# Patient Record
Sex: Male | Born: 1980 | Race: White | Hispanic: No | Marital: Married | Smoking: Never smoker
Health system: Southern US, Community
[De-identification: ages and names within clinical notes are randomized; demographics above are authoritative.]

## PROBLEM LIST (undated history)

## (undated) DIAGNOSIS — T7840XA Allergy, unspecified, initial encounter: Secondary | ICD-10-CM

## (undated) HISTORY — PX: EYE SURGERY: SHX253

## (undated) HISTORY — DX: Allergy, unspecified, initial encounter: T78.40XA

---

## 2020-08-04 ENCOUNTER — Encounter: Payer: Self-pay | Admitting: Internal Medicine

## 2020-08-04 ENCOUNTER — Other Ambulatory Visit: Payer: Self-pay

## 2020-08-04 ENCOUNTER — Ambulatory Visit (INDEPENDENT_AMBULATORY_CARE_PROVIDER_SITE_OTHER): Payer: BLUE CROSS/BLUE SHIELD | Admitting: Internal Medicine

## 2020-08-04 VITALS — BP 127/80 | HR 72 | Temp 97.8°F | Resp 18 | Ht 76.0 in | Wt 204.4 lb

## 2020-08-04 DIAGNOSIS — Z9189 Other specified personal risk factors, not elsewhere classified: Secondary | ICD-10-CM | POA: Insufficient documentation

## 2020-08-04 DIAGNOSIS — Z7689 Persons encountering health services in other specified circumstances: Secondary | ICD-10-CM | POA: Insufficient documentation

## 2020-08-04 DIAGNOSIS — Z8719 Personal history of other diseases of the digestive system: Secondary | ICD-10-CM

## 2020-08-04 DIAGNOSIS — Z889 Allergy status to unspecified drugs, medicaments and biological substances status: Secondary | ICD-10-CM | POA: Insufficient documentation

## 2020-08-04 DIAGNOSIS — Z23 Encounter for immunization: Secondary | ICD-10-CM | POA: Diagnosis not present

## 2020-08-04 NOTE — Assessment & Plan Note (Addendum)
Had colonoscopy about 2 years ago after episode of diverticulitis No active complaints currently

## 2020-08-04 NOTE — Patient Instructions (Signed)
Please get blood tests done today. We will discuss the blood test results in the next visit.  Please bring the wellness form in the next visit.  Please continue to follow healthy diet, consisting green vegetables and fruits. Please continue to perform moderate exercise as tolerated, at least 150 mins/week.

## 2020-08-04 NOTE — Assessment & Plan Note (Signed)
Allergy to pets (cats and dogs) according to the allergy reports Although patient has 2 cats, and does not have any severe reaction  Allergic to pollen, uses Flonase PRN

## 2020-08-04 NOTE — Progress Notes (Signed)
New Patient Office Visit  Subjective:  Patient ID: Kenneth Owens, male    DOB: Apr 02, 1981  Age: 39 y.o. MRN: [id number]  CC:  Chief Complaint  Patient presents with  . New Patient (Initial Visit)    new pt establishing care needs wellness for work he is fasting today for labs and will set up wellness appt this week     HPI Kenneth Owens is a 39 year old male with no significant past medical history presents for establishing care.  Patient moved from [place] recently and has not had any PCP for long time.  He states that he has been in good health overall and denies any active complaints currently.  Patient has had history of allergies in the past and has brought reports of allergy testing.  Although the allergy tests suggest tendency to cats, he states that he has 2 cats at home and has not had any severe reaction to that.  He reports a history of colonoscopy about 2 years ago after he had an episode of diverticulitis.  He denies active smoking.  He takes alcohol about twice in a week.  He denies any other substance use.  He is up-to-date with Covid vaccination.  He received flu vaccine in the office today.  Patient has brought wellness form for work, which requires lipid profile and fasting glucose levels.  Blood tests are ordered today.  Past Medical History:  Diagnosis Date  . Allergy    [facility] 08/01/2020    Past Surgical History:  Procedure Laterality Date  . EYE SURGERY N/A    [facility] 08/01/2020    History reviewed. No pertinent family history.  Social History   Socioeconomic History  . Marital status: Married    Spouse name: Not on file  . Number of children: Not on file  . Years of education: Not on file  . Highest education level: Not on file  Occupational History    Comment: [employer]  Tobacco Use  . Smoking status: Never Smoker  . Smokeless tobacco: Never Used  Substance and Sexual Activity  . Alcohol use: Yes    Comment: at least twice in a week  . Drug use:  Never  . Sexual activity: Never  Other Topics Concern  . Not on file  Social History Narrative  . Not on file   Social Determinants of Health   Financial Resource Strain:   . Difficulty of Paying Living Expenses: Not on file  Food Insecurity:   . Worried About Charity fundraiser in the Last Year: Not on file  . Ran Out of Food in the Last Year: Not on file  Transportation Needs:   . Lack of Transportation (Medical): Not on file  . Lack of Transportation (Non-Medical): Not on file  Physical Activity:   . Days of Exercise per Week: Not on file  . Minutes of Exercise per Session: Not on file  Stress:   . Feeling of Stress : Not on file  Social Connections:   . Frequency of Communication with Friends and Family: Not on file  . Frequency of Social Gatherings with Friends and Family: Not on file  . Attends Religious Services: Not on file  . Active Member of Clubs or Organizations: Not on file  . Attends Archivist Meetings: Not on file  . Marital Status: Not on file  Intimate Partner Violence:   . Fear of Current or Ex-Partner: Not on file  . Emotionally Abused: Not on file  . Physically  Abused: Not on file  . Sexually Abused: Not on file    ROS Review of Systems  Constitutional: Negative for chills and fever.  HENT: Negative for congestion, sinus pressure, sinus pain, sneezing and sore throat.   Eyes: Negative for pain and discharge.  Respiratory: Negative for cough and shortness of breath.   Cardiovascular: Negative for chest pain and palpitations.  Gastrointestinal: Negative for constipation, diarrhea, nausea and vomiting.  Endocrine: Negative for polydipsia and polyuria.  Genitourinary: Negative for dysuria and hematuria.  Musculoskeletal: Negative for neck pain and neck stiffness.  Skin: Negative for rash.  Neurological: Negative for dizziness, weakness, numbness and headaches.  Psychiatric/Behavioral: Negative for agitation and behavioral problems.     Objective:   Today's Vitals: BP 127/80 (BP Location: Right Arm, Patient Position: Sitting, Cuff Size: Normal)   Pulse 72   Temp 97.8 F (36.6 C) (Temporal)   Resp 18   Ht _0  (1.93 m)   Wt 204 lb 6.4 oz (92.7 kg)   SpO2 97%   BMI 24.88 kg/m   Physical Exam Vitals reviewed.  Constitutional:      General: He is not in acute distress.    Appearance: He is not diaphoretic.  HENT:     Head: Normocephalic and atraumatic.     Nose: Nose normal.     Mouth/Throat:     Mouth: Mucous membranes are moist.  Eyes:     General: No scleral icterus.    Extraocular Movements: Extraocular movements intact.     Pupils: Pupils are equal, round, and reactive to light.  Cardiovascular:     Rate and Rhythm: Normal rate and regular rhythm.     Pulses: Normal pulses.     Heart sounds: Normal heart sounds. No murmur heard.   Pulmonary:     Breath sounds: Normal breath sounds. No wheezing or rales.  Abdominal:     Palpations: Abdomen is soft.     Tenderness: There is no abdominal tenderness.  Musculoskeletal:     Cervical back: Neck supple. No tenderness.     Right lower leg: No edema.     Left lower leg: No edema.  Skin:    General: Skin is warm.     Findings: No rash.  Neurological:     General: No focal deficit present.     Mental Status: He is alert and oriented to person, place, and time.     Sensory: No sensory deficit.     Motor: No weakness.  Psychiatric:        Mood and Affect: Mood normal.        Behavior: Behavior normal.     Assessment & Plan:   Problem List Items Addressed This Visit      Other   Encounter to establish care    Care established Previous chart reviewed History and medications reviewed with the patient      Relevant Orders   CBC with Differential   CMP14+EGFR   Lipid Profile   Hepatitis C Antibody   T4 AND TSH   H/O diverticulitis of colon    Had colonoscopy about 2 years ago after episode of diverticulitis No active complaints  currently      H/O multiple allergies    Allergy to pets (cats and dogs) according to the allergy reports Although patient has 2 cats, and does not have any severe reaction  Allergic to pollen, uses Flonase PRN       Other Visit Diagnoses    Need  for immunization against influenza    -  Primary   Relevant Orders   Flu Vaccine QUAD 36+ mos IM (Completed)      Outpatient Encounter Medications as of 08/04/2020  Medication Sig  . fluticasone (FLONASE) 50 MCG/ACT nasal spray Place 1 spray into both nostrils as needed.   No facility-administered encounter medications on file as of 08/04/2020.    Follow-up: Return in about 1 week (around 08/11/2020) for Wellness form for work.   Lindell Spar, MD

## 2020-08-04 NOTE — Assessment & Plan Note (Signed)
Care established Previous chart reviewed History and medications reviewed with the patient 

## 2020-08-05 LAB — LIPID PANEL
Chol/HDL Ratio: 4.1 ratio (ref 0.0–5.0)
Cholesterol, Total: 236 mg/dL — ABNORMAL HIGH (ref 100–199)
HDL: 58 mg/dL (ref >39)
LDL Chol Calc (NIH): 156 mg/dL — ABNORMAL HIGH (ref 0–99)
Triglycerides: 124 mg/dL (ref 0–149)
VLDL Cholesterol Cal: 22 mg/dL (ref 5–40)

## 2020-08-05 LAB — CBC WITH DIFFERENTIAL/PLATELET
Basophils Absolute: 0.1 10*3/uL (ref 0.0–0.2)
Basos: 2 %
EOS (ABSOLUTE): 0.1 10*3/uL (ref 0.0–0.4)
Eos: 2 %
Hematocrit: 43.7 % (ref 37.5–51.0)
Hemoglobin: 14.7 g/dL (ref 13.0–17.7)
Immature Grans (Abs): 0 10*3/uL (ref 0.0–0.1)
Immature Granulocytes: 0 %
Lymphocytes Absolute: 1.5 10*3/uL (ref 0.7–3.1)
Lymphs: 30 %
MCH: 30.6 pg (ref 26.6–33.0)
MCHC: 33.6 g/dL (ref 31.5–35.7)
MCV: 91 fL (ref 79–97)
Monocytes Absolute: 0.5 10*3/uL (ref 0.1–0.9)
Monocytes: 9 %
Neutrophils Absolute: 3 10*3/uL (ref 1.4–7.0)
Neutrophils: 57 %
Platelets: 238 10*3/uL (ref 150–450)
RBC: 4.8 x10E6/uL (ref 4.14–5.80)
RDW: 11.9 % (ref 11.6–15.4)
WBC: 5.2 10*3/uL (ref 3.4–10.8)

## 2020-08-05 LAB — CMP14+EGFR
ALT: 35 IU/L (ref 0–44)
AST: 23 IU/L (ref 0–40)
Albumin/Globulin Ratio: 2.1 (ref 1.2–2.2)
Albumin: 4.7 g/dL (ref 4.0–5.0)
Alkaline Phosphatase: 94 IU/L (ref 44–121)
BUN/Creatinine Ratio: 11 (ref 9–20)
BUN: 12 mg/dL (ref 6–24)
Bilirubin Total: 0.4 mg/dL (ref 0.0–1.2)
CO2: 24 mmol/L (ref 20–29)
Calcium: 9.6 mg/dL (ref 8.7–10.2)
Chloride: 103 mmol/L (ref 96–106)
Creatinine, Ser: 1.09 mg/dL (ref 0.76–1.27)
GFR calc Af Amer: 97 mL/min/{1.73_m2} (ref >59)
GFR calc non Af Amer: 84 mL/min/{1.73_m2} (ref >59)
Globulin, Total: 2.2 g/dL (ref 1.5–4.5)
Glucose: 94 mg/dL (ref 65–99)
Potassium: 4.8 mmol/L (ref 3.5–5.2)
Sodium: 140 mmol/L (ref 134–144)
Total Protein: 6.9 g/dL (ref 6.0–8.5)

## 2020-08-05 LAB — T4 AND TSH
T4, Total: 6.2 ug/dL (ref 4.5–12.0)
TSH: 1.7 u[IU]/mL (ref 0.450–4.500)

## 2020-08-05 LAB — HEPATITIS C ANTIBODY: Hep C Virus Ab: 0.1 s/co ratio (ref 0.0–0.9)

## 2020-08-06 ENCOUNTER — Encounter: Payer: Self-pay | Admitting: Internal Medicine

## 2020-08-06 ENCOUNTER — Other Ambulatory Visit: Payer: Self-pay

## 2020-08-06 ENCOUNTER — Ambulatory Visit (INDEPENDENT_AMBULATORY_CARE_PROVIDER_SITE_OTHER): Payer: BLUE CROSS/BLUE SHIELD | Admitting: Internal Medicine

## 2020-08-06 VITALS — BP 135/81 | HR 83 | Temp 98.0°F | Resp 18 | Ht 76.0 in | Wt 202.8 lb

## 2020-08-06 DIAGNOSIS — E785 Hyperlipidemia, unspecified: Secondary | ICD-10-CM

## 2020-08-06 NOTE — Patient Instructions (Addendum)
Please continue to follow low-cholesterol diet and perform moderate exercise, 150 mins/week.

## 2020-08-06 NOTE — Progress Notes (Signed)
Established Patient Office Visit  Subjective:  Patient ID: Kenneth Owens, male    DOB: 1980/11/05  Age: 39 y.o. MRN: [id number]  CC:  Chief Complaint  Patient presents with   Annual Exam    wellness exam form for work to review labs also     HPI Kenneth Owens is a 39 year old male with no significant medical histroy presents for wellness form for work. Patient had blood tests 2 days ago, which showed hyperlipidemia. Blood test results were discussed with the patient. Wellness form provided to the patient.  Past Medical History:  Diagnosis Date   Allergy    [facility] 08/01/2020    Past Surgical History:  Procedure Laterality Date   EYE SURGERY N/A    [facility] 08/01/2020    History reviewed. No pertinent family history.  Social History   Socioeconomic History   Marital status: Married    Spouse name: Not on file   Number of children: Not on file   Years of education: Not on file   Highest education level: Not on file  Occupational History    Comment: [employer]  Tobacco Use   Smoking status: Never Smoker   Smokeless tobacco: Never Used  Substance and Sexual Activity   Alcohol use: Yes    Comment: at least twice in a week   Drug use: Never   Sexual activity: Never  Other Topics Concern   Not on file  Social History Narrative   Not on file   Social Determinants of Health   Financial Resource Strain:    Difficulty of Paying Living Expenses: Not on file  Food Insecurity:    Worried About Running Out of Food in the Last Year: Not on file   [employer] of Food in the Last Year: Not on file  Transportation Needs:    Lack of Transportation (Medical): Not on file   Lack of Transportation (Non-Medical): Not on file  Physical Activity:    Days of Exercise per Week: Not on file   Minutes of Exercise per Session: Not on file  Stress:    Feeling of Stress : Not on file  Social Connections:    Frequency of Communication with Friends and Family: Not on  file   Frequency of Social Gatherings with Friends and Family: Not on file   Attends Religious Services: Not on file   Active Member of Clubs or Organizations: Not on file   Attends Banker Meetings: Not on file   Marital Status: Not on file  Intimate Partner Violence:    Fear of Current or Ex-Partner: Not on file   Emotionally Abused: Not on file   Physically Abused: Not on file   Sexually Abused: Not on file    Outpatient Medications Prior to Visit  Medication Sig Dispense Refill   fluticasone (FLONASE) 50 MCG/ACT nasal spray Place 1 spray into both nostrils as needed.     No facility-administered medications prior to visit.    Allergies  Allergen Reactions   Shellfish Allergy     ROS Review of Systems  Constitutional: Negative for chills and fever.  HENT: Negative for congestion, sinus pressure, sinus pain, sneezing and sore throat.   Eyes: Negative for pain and discharge.  Respiratory: Negative for cough and shortness of breath.   Cardiovascular: Negative for chest pain and palpitations.  Gastrointestinal: Negative for constipation, diarrhea, nausea and vomiting.  Endocrine: Negative for polydipsia and polyuria.  Genitourinary: Negative for dysuria and hematuria.  Musculoskeletal: Negative for neck  pain and neck stiffness.  Skin: Negative for rash.  Neurological: Negative for dizziness, weakness, numbness and headaches.  Psychiatric/Behavioral: Negative for agitation and behavioral problems.      Objective:    Physical Exam Vitals reviewed.  Constitutional:      General: He is not in acute distress.    Appearance: He is not diaphoretic.  HENT:     Head: Normocephalic and atraumatic.     Nose: Nose normal.     Mouth/Throat:     Mouth: Mucous membranes are moist.  Eyes:     General: No scleral icterus.    Extraocular Movements: Extraocular movements intact.     Pupils: Pupils are equal, round, and reactive to light.  Cardiovascular:       Rate and Rhythm: Normal rate and regular rhythm.     Pulses: Normal pulses.     Heart sounds: Normal heart sounds. No murmur heard.   Pulmonary:     Breath sounds: Normal breath sounds. No wheezing or rales.  Abdominal:     Palpations: Abdomen is soft.     Tenderness: There is no abdominal tenderness.  Musculoskeletal:     Cervical back: Neck supple. No tenderness.     Right lower leg: No edema.     Left lower leg: No edema.  Skin:    General: Skin is warm.     Findings: No rash.  Neurological:     General: No focal deficit present.     Mental Status: He is alert and oriented to person, place, and time.     Sensory: No sensory deficit.     Motor: No weakness.  Psychiatric:        Mood and Affect: Mood normal.        Behavior: Behavior normal.     BP 135/81 (BP Location: Right Arm, Patient Position: Sitting, Cuff Size: Normal)    Pulse 83    Temp 98 F (36.7 C) (Temporal)    Resp 18    Ht 6\' 4"  (1.93 m)    Wt 202 lb 12.8 oz (92 kg)    SpO2 98%    BMI 24.69 kg/m  Wt Readings from Last 3 Encounters:  08/06/20 202 lb 12.8 oz (92 kg)  08/04/20 204 lb 6.4 oz (92.7 kg)     Health Maintenance Due  Topic Date Due   HIV Screening  Never done   TETANUS/TDAP  Never done    There are no preventive care reminders to display for this patient.  Lab Results  Component Value Date   TSH 1.700 08/04/2020   Lab Results  Component Value Date   WBC 5.2 08/04/2020   HGB 14.7 08/04/2020   HCT 43.7 08/04/2020   MCV 91 08/04/2020   PLT 238 08/04/2020   Lab Results  Component Value Date   NA 140 08/04/2020   K 4.8 08/04/2020   CO2 24 08/04/2020   GLUCOSE 94 08/04/2020   BUN 12 08/04/2020   CREATININE 1.09 08/04/2020   BILITOT 0.4 08/04/2020   ALKPHOS 94 08/04/2020   AST 23 08/04/2020   ALT 35 08/04/2020   PROT 6.9 08/04/2020   ALBUMIN 4.7 08/04/2020   CALCIUM 9.6 08/04/2020   Lab Results  Component Value Date   CHOL 236 (H) 08/04/2020   Lab Results  Component  Value Date   HDL 58 08/04/2020   Lab Results  Component Value Date   LDLCALC 156 (H) 08/04/2020   Lab Results  Component Value Date  TRIG 124 08/04/2020   Lab Results  Component Value Date   CHOLHDL 4.1 08/04/2020   No results found for: HGBA1C    Assessment & Plan:   Wellness paperwork for work Filled out and provided to the patient. Blood test results reviewed with the patient  Hyperlipidemia Lipid profile reviewed Diet modification discussed Moderate exercise as tolerated, 150 mins/week  No orders of the defined types were placed in this encounter.   Follow-up: Return in about 1 year (around 08/06/2021).    Anabel Halon, MD

## 2021-08-09 ENCOUNTER — Ambulatory Visit: Payer: BLUE CROSS/BLUE SHIELD | Admitting: Internal Medicine

## 2021-09-13 ENCOUNTER — Ambulatory Visit (HOSPITAL_COMMUNITY)
Admission: RE | Admit: 2021-09-13 | Discharge: 2021-09-13 | Disposition: A | Discharge status: Home / Self Care | Payer: BC Managed Care – PPO | Source: Ambulatory Visit | Attending: Nurse Practitioner | Admitting: Nurse Practitioner

## 2021-09-13 ENCOUNTER — Ambulatory Visit (INDEPENDENT_AMBULATORY_CARE_PROVIDER_SITE_OTHER): Payer: BC Managed Care – PPO | Admitting: Nurse Practitioner

## 2021-09-13 ENCOUNTER — Other Ambulatory Visit: Payer: Self-pay

## 2021-09-13 ENCOUNTER — Encounter: Payer: Self-pay | Admitting: Nurse Practitioner

## 2021-09-13 VITALS — BP 138/83 | HR 75 | Ht 76.0 in | Wt 203.0 lb

## 2021-09-13 DIAGNOSIS — E782 Mixed hyperlipidemia: Secondary | ICD-10-CM | POA: Insufficient documentation

## 2021-09-13 DIAGNOSIS — R030 Elevated blood-pressure reading, without diagnosis of hypertension: Secondary | ICD-10-CM

## 2021-09-13 DIAGNOSIS — S62346A Nondisplaced fracture of base of fifth metacarpal bone, right hand, initial encounter for closed fracture: Secondary | ICD-10-CM

## 2021-09-13 DIAGNOSIS — E785 Hyperlipidemia, unspecified: Secondary | ICD-10-CM | POA: Diagnosis not present

## 2021-09-13 DIAGNOSIS — M79641 Pain in right hand: Secondary | ICD-10-CM | POA: Insufficient documentation

## 2021-09-13 NOTE — Assessment & Plan Note (Signed)
BP Readings from Last 3 Encounters:  09/13/21 138/83  08/06/20 135/81  08/04/20 127/80  Importance of low salt, low fat  diet and regular vigorous exercise discussed.

## 2021-09-13 NOTE — Assessment & Plan Note (Addendum)
Importance of low fat diet and vigorous regular exercise discussed. PT stated that his lipid levels were stable based on his last lad work at work. PT encouraged to bring  His lab work result to the clinic so we can have them in his record here.

## 2021-09-13 NOTE — Assessment & Plan Note (Addendum)
Right hand pain and swelling due to trauma.  Xray of the right hand ordered.  Continue ibuprofen as needed for pain, compression,  Xray shows questionable non displaced fracture of Rt 5th metacarpal.  Refer to autho.  Use 2 finger splint till PT sees otho.

## 2021-09-13 NOTE — Patient Instructions (Signed)
Please get the xray of your hands done. We will get back to you about the result.   Thanks for choosing [facility], we consider it a privelige to serve you.

## 2021-09-13 NOTE — Progress Notes (Signed)
Acute Office Visit  Subjective:    Patient ID: Kenneth Owens, male    DOB: 12/04/1980, 40 y.o.   MRN: [id number]  Chief Complaint  Patient presents with   Hand Injury    [place] 11[place]18 and landed on his right hand. Hand is swollen and hurts when making a fit. Pain does radiate up into his elbow    Hand Injury  Pertinent negatives include no chest pain.  Patient is in today for c/o of pain and swelling to his rand hand. He fell in the attic on 11/18, he cant remember if he fell on his hand or not but his right hand has been swollen and hurting since then Pain is 4/10 when he flex his hand. . He has a compression wrap on his right hand and he has been taking ibuprofen 600 mg for pain, applying ice and keeping the right hand elevated. He has not been able to play golf due to pain in his right hand He denies numbness , tingling , fever, chils, malaise . He is able to move all his fingers and hand.   PT refused his flu vac and TDAP vaccine. PT encouraged to wear mask when he is around people due to rising incidence of flu.   PT stated that he had his annual exam and labs done this year May at work. Pt encouraged to bring his lab result to the clinic so we can have it in his records.   Past Medical History:  Diagnosis Date   Allergy    [facility]reesia 08/01/2020    Past Surgical History:  Procedure Laterality Date   EYE SURGERY N/A    [facility] 08/01/2020    History reviewed. No pertinent family history.  Social History   Socioeconomic History   Marital status: Married    Spouse name: Not on file   Number of children: Not on file   Years of education: Not on file   Highest education level: Not on file  Occupational History    Comment: [employer]  Tobacco Use   Smoking status: Never   Smokeless tobacco: Never  Substance and Sexual Activity   Alcohol use: Yes    Comment: at least twice in a week   Drug use: Never   Sexual activity: Never  Other Topics Concern   Not on  file  Social History Narrative   Not on file   Social Determinants of Health   Financial Resource Strain: Not on file  Food Insecurity: Not on file  Transportation Needs: Not on file  Physical Activity: Not on file  Stress: Not on file  Social Connections: Not on file  Intimate Partner Violence: Not on file    Outpatient Medications Prior to Visit  Medication Sig Dispense Refill   fluticasone (FLONASE) 50 MCG/ACT nasal spray Place 1 spray into both nostrils as needed.     Multiple Vitamin (MULTIVITAMIN) tablet Take 1 tablet by mouth daily.     No facility-administered medications prior to visit.    Allergies  Allergen Reactions   Shellfish Allergy     Review of Systems  Constitutional:  Negative for activity change, appetite change, chills and fever.  Respiratory: Negative.  Negative for cough, chest tightness, shortness of breath and wheezing.   Cardiovascular: Negative.  Negative for chest pain, palpitations and leg swelling.  Gastrointestinal: Negative.  Negative for abdominal distention, abdominal pain, blood in stool, constipation, nausea and vomiting.  Musculoskeletal:  Positive for joint swelling. Negative  for myalgias, neck pain and neck stiffness.       Has Right hand pain from trauma.   Skin:  Negative for color change, rash and wound.  Psychiatric/Behavioral: Negative.  Negative for agitation, behavioral problems and confusion. The patient is not nervous/anxious.       Objective:    Physical Exam Constitutional:      General: He is not in acute distress.    Appearance: Normal appearance. He is normal weight. He is not ill-appearing, toxic-appearing or diaphoretic.  Cardiovascular:     Rate and Rhythm: Normal rate and regular rhythm.     Pulses: Normal pulses.     Heart sounds: Normal heart sounds. No murmur heard.   No friction rub. No gallop.  Pulmonary:     Effort: Pulmonary effort is normal. No respiratory distress.     Breath sounds: Normal breath  sounds. No stridor. No wheezing, rhonchi or rales.  Chest:     Chest wall: No tenderness.  Abdominal:     General: Abdomen is flat.     Palpations: Abdomen is soft. There is no mass.     Tenderness: There is no abdominal tenderness. There is no guarding or rebound.  Musculoskeletal:        General: Swelling, tenderness and signs of injury present. No deformity.     Right lower leg: No edema.     Left lower leg: No edema.     Comments: Tenderness on palpation of right metacarpal joint with mild swelling noted. Able to do ROM of the hand and fingers, no tingling , no numbness.   Skin:    General: Skin is warm.     Capillary Refill: Capillary refill takes less than 2 seconds.     Coloration: Skin is not jaundiced or pale.     Findings: No bruising, erythema, lesion or rash.  Neurological:     General: No focal deficit present.     Mental Status: He is alert and oriented to person, place, and time.     Cranial Nerves: No cranial nerve deficit.     Sensory: No sensory deficit.     Motor: No weakness.     Gait: Gait normal.  Psychiatric:        Mood and Affect: Mood normal.        Behavior: Behavior normal.        Thought Content: Thought content normal.        Judgment: Judgment normal.    BP 138/83 (BP Location: Left Arm, Patient Position: Sitting, Cuff Size: Normal)   Pulse 75   Ht 6\' 4"  (1.93 m)   Wt 203 lb (92.1 kg)   SpO2 98%   BMI 24.71 kg/m  Wt Readings from Last 3 Encounters:  09/13/21 203 lb (92.1 kg)  08/06/20 202 lb 12.8 oz (92 kg)  08/04/20 204 lb 6.4 oz (92.7 kg)    Health Maintenance Due  Topic Date Due   HIV Screening  Never done   TETANUS/TDAP  Never done   INFLUENZA VACCINE  05/24/2021    There are no preventive care reminders to display for this patient.   Lab Results  Component Value Date   TSH 1.700 08/04/2020   Lab Results  Component Value Date   WBC 5.2 08/04/2020   HGB 14.7 08/04/2020   HCT 43.7 08/04/2020   MCV 91 08/04/2020   PLT 238  08/04/2020   Lab Results  Component Value Date   NA 140 08/04/2020  K 4.8 08/04/2020   CO2 24 08/04/2020   GLUCOSE 94 08/04/2020   BUN 12 08/04/2020   CREATININE 1.09 08/04/2020   BILITOT 0.4 08/04/2020   ALKPHOS 94 08/04/2020   AST 23 08/04/2020   ALT 35 08/04/2020   PROT 6.9 08/04/2020   ALBUMIN 4.7 08/04/2020   CALCIUM 9.6 08/04/2020   Lab Results  Component Value Date   CHOL 236 (H) 08/04/2020   Lab Results  Component Value Date   HDL 58 08/04/2020   Lab Results  Component Value Date   LDLCALC 156 (H) 08/04/2020   Lab Results  Component Value Date   TRIG 124 08/04/2020   Lab Results  Component Value Date   CHOLHDL 4.1 08/04/2020   No results found for: HGBA1C     Assessment & Plan:   Problem List Items Addressed This Visit   None Visit Diagnoses     Right hand pain    -  Primary   Relevant Orders   DG Hand Complete Right        No orders of the defined types were placed in this encounter.    Donell Beers, FNP

## 2021-09-14 ENCOUNTER — Encounter: Payer: Self-pay | Admitting: Orthopaedic Surgery

## 2021-09-15 ENCOUNTER — Other Ambulatory Visit: Payer: Self-pay

## 2021-09-15 ENCOUNTER — Ambulatory Visit (INDEPENDENT_AMBULATORY_CARE_PROVIDER_SITE_OTHER): Payer: BC Managed Care – PPO | Admitting: Orthopedic Surgery

## 2021-09-15 ENCOUNTER — Encounter: Payer: Self-pay | Admitting: Orthopedic Surgery

## 2021-09-15 VITALS — BP 156/81 | HR 79 | Ht 76.0 in | Wt 201.0 lb

## 2021-09-15 DIAGNOSIS — S60221A Contusion of right hand, initial encounter: Secondary | ICD-10-CM

## 2021-09-15 NOTE — Progress Notes (Signed)
New Patient Visit  Assessment: Kenneth Owens is a 40 y.o. male with the following: 1. Contusion of right hand, initial encounter  Plan: Patient sustained a contusion to the right hand.  Radiographs demonstrated a possible fracture at the distal extent of the fifth metacarpal, which is an old injury.  He has no tenderness here.  On physical exam, he has no tenderness to palpation, with good strength and excellent range of motion.  His pain is continued to improve since falling, and I am not concerned about additional injuries.  Anticipate this will continue to improve.  Tylenol or ibuprofen as needed for pain.  Follow-up: Return if symptoms worsen or fail to improve.  Subjective:  Chief Complaint  Patient presents with   Hand Injury    Rt hand DOI 09/10/21    History of Present Illness: Kenneth Owens is a 40 y.o. male who has been referred to clinic today by Trena Platt, MD for evaluation of right hand pain.  He was working in an attic less than a week ago, when he lost his balance, and fell.  He tried to brace himself with his right hand, and did not want to ruin some drywall.  Subsequently, he hit his right hand.  The pain progressively worsened over the weekend.  He tried to ice it, and take medications as needed.  He was taping his fingers together, to provide some stability.  He tried to play golf over the weekend, but was unable to grip the club.  He notes pain in the middle aspect of his hand, with radiating pains into his forearm.  Since the injury, his pain is progressively improved.  He was evaluated by his PCP, who placed an urgent referral.   Review of Systems: No fevers or chills No numbness or tingling No chest pain No shortness of breath No bowel or bladder dysfunction No GI distress No headaches   Medical History:  Past Medical History:  Diagnosis Date   Allergy    [facility]reesia 08/01/2020    Past Surgical History:  Procedure Laterality Date   EYE  SURGERY N/A    [facility]ia 08/01/2020    No family history on file. Social History   Tobacco Use   Smoking status: Never   Smokeless tobacco: Never  Substance Use Topics   Alcohol use: Yes    Comment: at least twice in a week   Drug use: Never    Allergies  Allergen Reactions   Shellfish Allergy     No outpatient medications have been marked as taking for the 09/15/21 encounter (Office Visit) with Oliver Barre, MD.    Objective: BP (!) 156/81   Pulse 79   Ht 6\' 4"  (1.93 m)   Wt 201 lb (91.2 kg)   BMI 24.47 kg/m   Physical Exam:  General: Alert and oriented. and No acute distress. Gait: Normal gait.  Evaluation of the right hand demonstrates no deformity.  No bruising is appreciated.  Mild swelling in the mid aspect of the hand.  He has full range of motion of all fingers.  No tenderness to palpation over the fifth metacarpal.  5/5 grip strength.  Sensations intact throughout the right hand.  IMAGING: I personally reviewed images previously obtained in clinic  X-ray of the right hand was obtained in clinic.  There is evidence of a previous injury at the distal aspect of the right fifth metacarpal.  No other injuries are noted.  No dislocations.   New Medications:  No orders of the defined types were placed in this encounter.     Oliver Barre, MD  09/15/2021 8:59 AM

## 2022-05-06 ENCOUNTER — Ambulatory Visit (INDEPENDENT_AMBULATORY_CARE_PROVIDER_SITE_OTHER): Payer: BC Managed Care – PPO | Admitting: Internal Medicine

## 2022-05-06 ENCOUNTER — Encounter: Payer: Self-pay | Admitting: Internal Medicine

## 2022-05-06 VITALS — BP 124/88 | HR 73 | Resp 18 | Ht 76.0 in | Wt 200.0 lb

## 2022-05-06 DIAGNOSIS — E782 Mixed hyperlipidemia: Secondary | ICD-10-CM | POA: Diagnosis not present

## 2022-05-06 DIAGNOSIS — G250 Essential tremor: Secondary | ICD-10-CM | POA: Insufficient documentation

## 2022-05-06 DIAGNOSIS — Z0001 Encounter for general adult medical examination with abnormal findings: Secondary | ICD-10-CM | POA: Diagnosis not present

## 2022-05-06 DIAGNOSIS — E559 Vitamin D deficiency, unspecified: Secondary | ICD-10-CM | POA: Diagnosis not present

## 2022-05-06 NOTE — Patient Instructions (Signed)
Please continue taking multivitamin supplements.  Please continue to follow heart healthy diet and perform moderate exercise/walking at least 150 mins/week.

## 2022-05-06 NOTE — Assessment & Plan Note (Signed)
Physical exam as documented. Fasting blood test today.

## 2022-05-06 NOTE — Assessment & Plan Note (Signed)
Check lipid profile Advised to follow low cholesterol diet for now 

## 2022-05-06 NOTE — Progress Notes (Signed)
Established Patient Office Visit  Subjective:  Patient ID: Kenneth Owens, male    DOB: [birth year]  Age: 41 y.o. MRN: [id number]  CC:  Chief Complaint  Patient presents with   Annual Exam    Annual exam     HPI Kenneth Owens is a 41 y.o. male with past medical history of HLD and tremors who presents for annual physical.  He did reports chronic fine tremors of the bilateral hands.  He states that he would have tremors when he is hungry and sometimes while doing fine work.  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  Denies any numbness, tingling or weakness of the UE.  He has history of chronic neck pain and has been told of DDD of cervical spine, for which he has seen chiropractor.     Past Medical History:  Diagnosis Date   Allergy    [facility]ia 08/01/2020    Past Surgical History:  Procedure Laterality Date   EYE SURGERY N/A    [facility]ia 08/01/2020    History reviewed. No pertinent family history.  Social History   Socioeconomic History   Marital status: Married    Spouse name: Not on file   Number of children: Not on file   Years of education: Not on file   Highest education level: Not on file  Occupational History    Comment: [employer]  Tobacco Use   Smoking status: Never   Smokeless tobacco: Never  Substance and Sexual Activity   Alcohol use: Yes    Comment: at least twice in a week   Drug use: Never   Sexual activity: Never  Other Topics Concern   Not on file  Social History Narrative   Not on file   Social Determinants of Health   Financial Resource Strain: Not on file  Food Insecurity: Not on file  Transportation Needs: Not on file  Physical Activity: Not on file  Stress: Not on file  Social Connections: Not on file  Intimate Partner Violence: Not on file    Outpatient Medications Prior to Visit  Medication Sig Dispense Refill   Multiple Vitamin  (MULTIVITAMIN) tablet Take 1 tablet by mouth daily.     fluticasone (FLONASE) 50 MCG/ACT nasal spray Place 1 spray into both nostrils as needed. (Patient not taking: Reported on 05/06/2022)     No facility-administered medications prior to visit.    Allergies  Allergen Reactions   Shellfish Allergy     ROS Review of Systems  Constitutional:  Negative for chills and fever.  HENT:  Negative for congestion, sinus pressure, sinus pain, sneezing and sore throat.   Eyes:  Negative for pain and discharge.  Respiratory:  Negative for cough and shortness of breath.   Cardiovascular:  Negative for chest pain and palpitations.  Gastrointestinal:  Negative for constipation, diarrhea, nausea and vomiting.  Endocrine: Negative for polydipsia and polyuria.  Genitourinary:  Negative for dysuria and hematuria.  Musculoskeletal:  Negative for neck pain and neck stiffness.  Skin:  Negative for rash.  Neurological:  Positive for tremors. Negative for dizziness, weakness, numbness and headaches.  Psychiatric/Behavioral:  Negative for agitation and behavioral problems.       Objective:    Physical Exam Vitals reviewed.  Constitutional:      General: He is not in acute distress.    Appearance: He is not diaphoretic.  HENT:     Head: Normocephalic and atraumatic.  Nose: Nose normal.     Mouth/Throat:     Mouth: Mucous membranes are moist.  Eyes:     General: No scleral icterus.    Extraocular Movements: Extraocular movements intact.     Pupils: Pupils are equal, round, and reactive to light.  Cardiovascular:     Rate and Rhythm: Normal rate and regular rhythm.     Pulses: Normal pulses.     Heart sounds: Normal heart sounds. No murmur heard. Pulmonary:     Breath sounds: Normal breath sounds. No wheezing or rales.  Abdominal:     Palpations: Abdomen is soft.     Tenderness: There is no abdominal tenderness.  Musculoskeletal:     Cervical back: Neck supple. No tenderness.     Right  lower leg: No edema.     Left lower leg: No edema.  Skin:    General: Skin is warm.     Findings: No rash.  Neurological:     General: No focal deficit present.     Mental Status: He is alert and oriented to person, place, and time.     Sensory: No sensory deficit.     Motor: No weakness.     Comments: Fine tremors of b/l hands  Psychiatric:        Mood and Affect: Mood normal.        Behavior: Behavior normal.     BP 124/88 (BP Location: Right Arm, Patient Position: Sitting, Cuff Size: Normal)   Pulse 73   Resp 18   Ht 6' 4" (1.93 m)   Wt 200 lb (90.7 kg)   SpO2 97%   BMI 24.34 kg/m  Wt Readings from Last 3 Encounters:  05/06/22 200 lb (90.7 kg)  09/15/21 201 lb (91.2 kg)  09/13/21 203 lb (92.1 kg)    Lab Results  Component Value Date   TSH 1.700 08/04/2020   Lab Results  Component Value Date   WBC 5.2 08/04/2020   HGB 14.7 08/04/2020   HCT 43.7 08/04/2020   MCV 91 08/04/2020   PLT 238 08/04/2020   Lab Results  Component Value Date   NA 140 08/04/2020   K 4.8 08/04/2020   CO2 24 08/04/2020   GLUCOSE 94 08/04/2020   BUN 12 08/04/2020   CREATININE 1.09 08/04/2020   BILITOT 0.4 08/04/2020   ALKPHOS 94 08/04/2020   AST 23 08/04/2020   ALT 35 08/04/2020   PROT 6.9 08/04/2020   ALBUMIN 4.7 08/04/2020   CALCIUM 9.6 08/04/2020   Lab Results  Component Value Date   CHOL 236 (H) 08/04/2020   Lab Results  Component Value Date   HDL 58 08/04/2020   Lab Results  Component Value Date   LDLCALC 156 (H) 08/04/2020   Lab Results  Component Value Date   TRIG 124 08/04/2020   Lab Results  Component Value Date   CHOLHDL 4.1 08/04/2020   No results found for: "HGBA1C"    Assessment & Plan:   Problem List Items Addressed This Visit       Nervous and Auditory   Essential tremor    His fine tremors likely benign essential tremors Advised to maintain adequate hydration and eat at regular intervals Avoid caffeinated products Does not affect  functional status currently If worsens, will chart propranolol and/or get neurology evaluation        Other   Hyperlipidemia    Check lipid profile Advised to follow low cholesterol diet for now  Relevant Orders   Lipid panel   Encounter for general adult medical examination with abnormal findings - Primary    Physical exam as documented. Fasting blood test today.      Relevant Orders   TSH   Hemoglobin A1c   CMP14+EGFR   CBC with Differential/Platelet   Other Visit Diagnoses     Vitamin D deficiency       Relevant Orders   VITAMIN D 25 Hydroxy (Vit-D Deficiency, Fractures)       No orders of the defined types were placed in this encounter.   Follow-up: Return in about 1 year (around 05/07/2023) for Annual physical.    Lindell Spar, MD

## 2022-05-06 NOTE — Assessment & Plan Note (Signed)
His fine tremors likely benign essential tremors Advised to maintain adequate hydration and eat at regular intervals Avoid caffeinated products Does not affect functional status currently If worsens, will chart propranolol and/or get neurology evaluation

## 2022-05-10 ENCOUNTER — Telehealth: Payer: Self-pay | Admitting: Internal Medicine

## 2022-05-10 NOTE — Telephone Encounter (Signed)
Patient advised with verbal understanding  

## 2022-05-10 NOTE — Telephone Encounter (Signed)
Pt returning phone call ° °

## 2022-05-13 LAB — CMP14+EGFR
ALT: 41 IU/L (ref 0–44)
AST: 35 IU/L (ref 0–40)
Albumin/Globulin Ratio: 1.6 (ref 1.2–2.2)
Albumin: 4.6 g/dL (ref 4.1–5.1)
Alkaline Phosphatase: 97 IU/L (ref 44–121)
BUN/Creatinine Ratio: 16 (ref 9–20)
BUN: 17 mg/dL (ref 6–24)
Bilirubin Total: 0.6 mg/dL (ref 0.0–1.2)
CO2: 16 mmol/L — ABNORMAL LOW (ref 20–29)
Calcium: 9.7 mg/dL (ref 8.7–10.2)
Creatinine, Ser: 1.04 mg/dL (ref 0.76–1.27)
Globulin, Total: 2.8 g/dL (ref 1.5–4.5)
Glucose: 85 mg/dL (ref 70–99)
Total Protein: 7.4 g/dL (ref 6.0–8.5)
eGFR: 91 mL/min/{1.73_m2} (ref >59)

## 2022-05-13 LAB — CBC WITH DIFFERENTIAL/PLATELET
Basophils Absolute: 0.1 10*3/uL (ref 0.0–0.2)
Basos: 2 %
EOS (ABSOLUTE): 0.1 10*3/uL (ref 0.0–0.4)
Eos: 3 %
Hematocrit: 46.3 % (ref 37.5–51.0)
Hemoglobin: 15.3 g/dL (ref 13.0–17.7)
Immature Grans (Abs): 0 10*3/uL (ref 0.0–0.1)
Immature Granulocytes: 0 %
Lymphocytes Absolute: 1.6 10*3/uL (ref 0.7–3.1)
Lymphs: 31 %
MCH: 30.2 pg (ref 26.6–33.0)
MCHC: 33 g/dL (ref 31.5–35.7)
MCV: 92 fL (ref 79–97)
Monocytes Absolute: 0.6 10*3/uL (ref 0.1–0.9)
Monocytes: 12 %
Neutrophils Absolute: 2.8 10*3/uL (ref 1.4–7.0)
Neutrophils: 52 %
Platelets: 222 10*3/uL (ref 150–450)
RBC: 5.06 x10E6/uL (ref 4.14–5.80)
RDW: 11.9 % (ref 11.6–15.4)
WBC: 5.2 10*3/uL (ref 3.4–10.8)

## 2022-05-13 LAB — VITAMIN D 25 HYDROXY (VIT D DEFICIENCY, FRACTURES): Vit D, 25-Hydroxy: 35 ng/mL (ref 30.0–100.0)

## 2022-05-13 LAB — LIPID PANEL
Chol/HDL Ratio: 4.2 ratio (ref 0.0–5.0)
Cholesterol, Total: 252 mg/dL — ABNORMAL HIGH (ref 100–199)
HDL: 60 mg/dL (ref >39)
LDL Chol Calc (NIH): 162 mg/dL — ABNORMAL HIGH (ref 0–99)
Triglycerides: 168 mg/dL — ABNORMAL HIGH (ref 0–149)
VLDL Cholesterol Cal: 30 mg/dL (ref 5–40)

## 2022-05-13 LAB — HEMOGLOBIN A1C
Est. average glucose Bld gHb Est-mCnc: 111 mg/dL
Hgb A1c MFr Bld: 5.5 % (ref 4.8–5.6)

## 2022-05-13 LAB — TSH: TSH: 1.64 u[IU]/mL (ref 0.450–4.500)

## 2022-06-21 ENCOUNTER — Telehealth: Payer: Self-pay

## 2022-06-21 NOTE — Telephone Encounter (Signed)
Health form - discount health lower premium form for work  Copied Noted Sleeved

## 2022-06-23 NOTE — Telephone Encounter (Signed)
Called patient will come by office to sign form and then we can fax to [phone].

## 2022-08-24 ENCOUNTER — Ambulatory Visit (INDEPENDENT_AMBULATORY_CARE_PROVIDER_SITE_OTHER): Payer: BC Managed Care – PPO | Admitting: Internal Medicine

## 2022-08-24 ENCOUNTER — Encounter: Payer: Self-pay | Admitting: Internal Medicine

## 2022-08-24 DIAGNOSIS — E782 Mixed hyperlipidemia: Secondary | ICD-10-CM

## 2022-08-24 MED ORDER — ROSUVASTATIN CALCIUM 5 MG PO TABS: 5.0000 mg | ORAL_TABLET | Freq: Every day | ORAL | 1 refills | Status: DC

## 2022-08-24 NOTE — Assessment & Plan Note (Signed)
Lipid profile reviewed Has had persistently elevated LDL despite low cholesterol diet Started Crestor 5 mg QD

## 2022-08-24 NOTE — Patient Instructions (Signed)
Please start taking Crestor as prescribed.  Please get fasting blood tests done after 3 months.

## 2022-08-24 NOTE — Progress Notes (Signed)
Virtual Visit via Telephone Note   This visit type was conducted via telephone. This format is felt to be most appropriate for this patient at this time.  The patient did not have access to video technology/had technical difficulties with video requiring transitioning to audio format only (telephone).  All issues noted in this document were discussed and addressed.  No physical exam could be performed with this format.  Evaluation Performed:  Follow-up visit  Date:  08/24/2022   ID:  Kenneth Owens, DOB 1981-04-28, MRN [id number]  Patient Location: Home Provider Location: Office/Clinic  Participants: Patient Location of Patient: Home Location of Provider: Telehealth Consent was obtain for visit to be over via telehealth. I verified that I am speaking with the correct person using two identifiers.  PCP:  Lindell Spar, MD   Chief Complaint: Follow-up of hyperlipidemia  History of Present Illness:    Kenneth Owens is a 41 y.o. male who has a televisit for discussing management of hyperlipidemia.  His last blood test showed persistently elevated lipid profile.  He reports that he does not eat oily or fried food and mostly eats home-cooked food.  He has lots of green vegetables in his diet.  His BP has been WNL.  Denies any significant family history of cardiac issues.  The patient does not have symptoms concerning for COVID-19 infection (fever, chills, cough, or new shortness of breath).   Past Medical, Surgical, Social History, Allergies, and Medications have been Reviewed.  Past Medical History:  Diagnosis Date   Allergy    [facility]reesia 08/01/2020   Past Surgical History:  Procedure Laterality Date   EYE SURGERY N/A    [facility]ia 08/01/2020     Current Meds  Medication Sig   Multiple Vitamin (MULTIVITAMIN) tablet Take 1 tablet by mouth daily.   rosuvastatin (CRESTOR) 5 MG tablet Take 1 tablet (5 mg total) by mouth daily.     Allergies:   Shellfish allergy    ROS:   Please see the history of present illness.     All other systems reviewed and are negative.   Labs/Other Tests and Data Reviewed:    Recent Labs: 05/06/2022: ALT 41; BUN 17; Creatinine, Ser 1.04; Hemoglobin 15.3; Platelets 222; Potassium CANCELED; Sodium CANCELED; TSH 1.640   Recent Lipid Panel Lab Results  Component Value Date/Time   CHOL 252 (H) 05/06/2022 08:43 AM   TRIG 168 (H) 05/06/2022 08:43 AM   HDL 60 05/06/2022 08:43 AM   CHOLHDL 4.2 05/06/2022 08:43 AM   LDLCALC 162 (H) 05/06/2022 08:43 AM    Wt Readings from Last 3 Encounters:  05/06/22 200 lb (90.7 kg)  09/15/21 201 lb (91.2 kg)  09/13/21 203 lb (92.1 kg)     ASSESSMENT & PLAN:    Mixed hyperlipidemia Lipid profile reviewed Has had persistently elevated LDL despite low cholesterol diet Started Crestor 5 mg QD    Time:   Today, I have spent 7 minutes reviewing the chart, including problem list, medications, and with the patient with telehealth technology discussing the above problems.   Medication Adjustments/Labs and Tests Ordered: Current medicines are reviewed at length with the patient today.  Concerns regarding medicines are outlined above.   Tests Ordered: Orders Placed This Encounter  Procedures   CMP14+EGFR   Lipid Profile    Medication Changes: Meds ordered this encounter  Medications   rosuvastatin (CRESTOR) 5 MG tablet    Sig: Take 1 tablet (5 mg total) by mouth daily.  Dispense:  90 tablet    Refill:  1     Note: This dictation was prepared with Dragon dictation along with smaller phrase technology. Similar sounding words can be transcribed inadequately or may not be corrected upon review. Any transcriptional errors that result from this process are unintentional.      Disposition:  Follow up  Signed, Lindell Spar, MD  08/24/2022 5:22 PM     [facility]

## 2022-11-03 IMAGING — CR DG HAND COMPLETE 3+V*R*
3 series · 3 of 3 positions shown · non-contrast
Comparison: None

CLINICAL DATA: Trauma, RIGHT hand pain and swelling at second and
third digits

EXAM:
RIGHT HAND - COMPLETE 3+ VIEW

[x pa right]
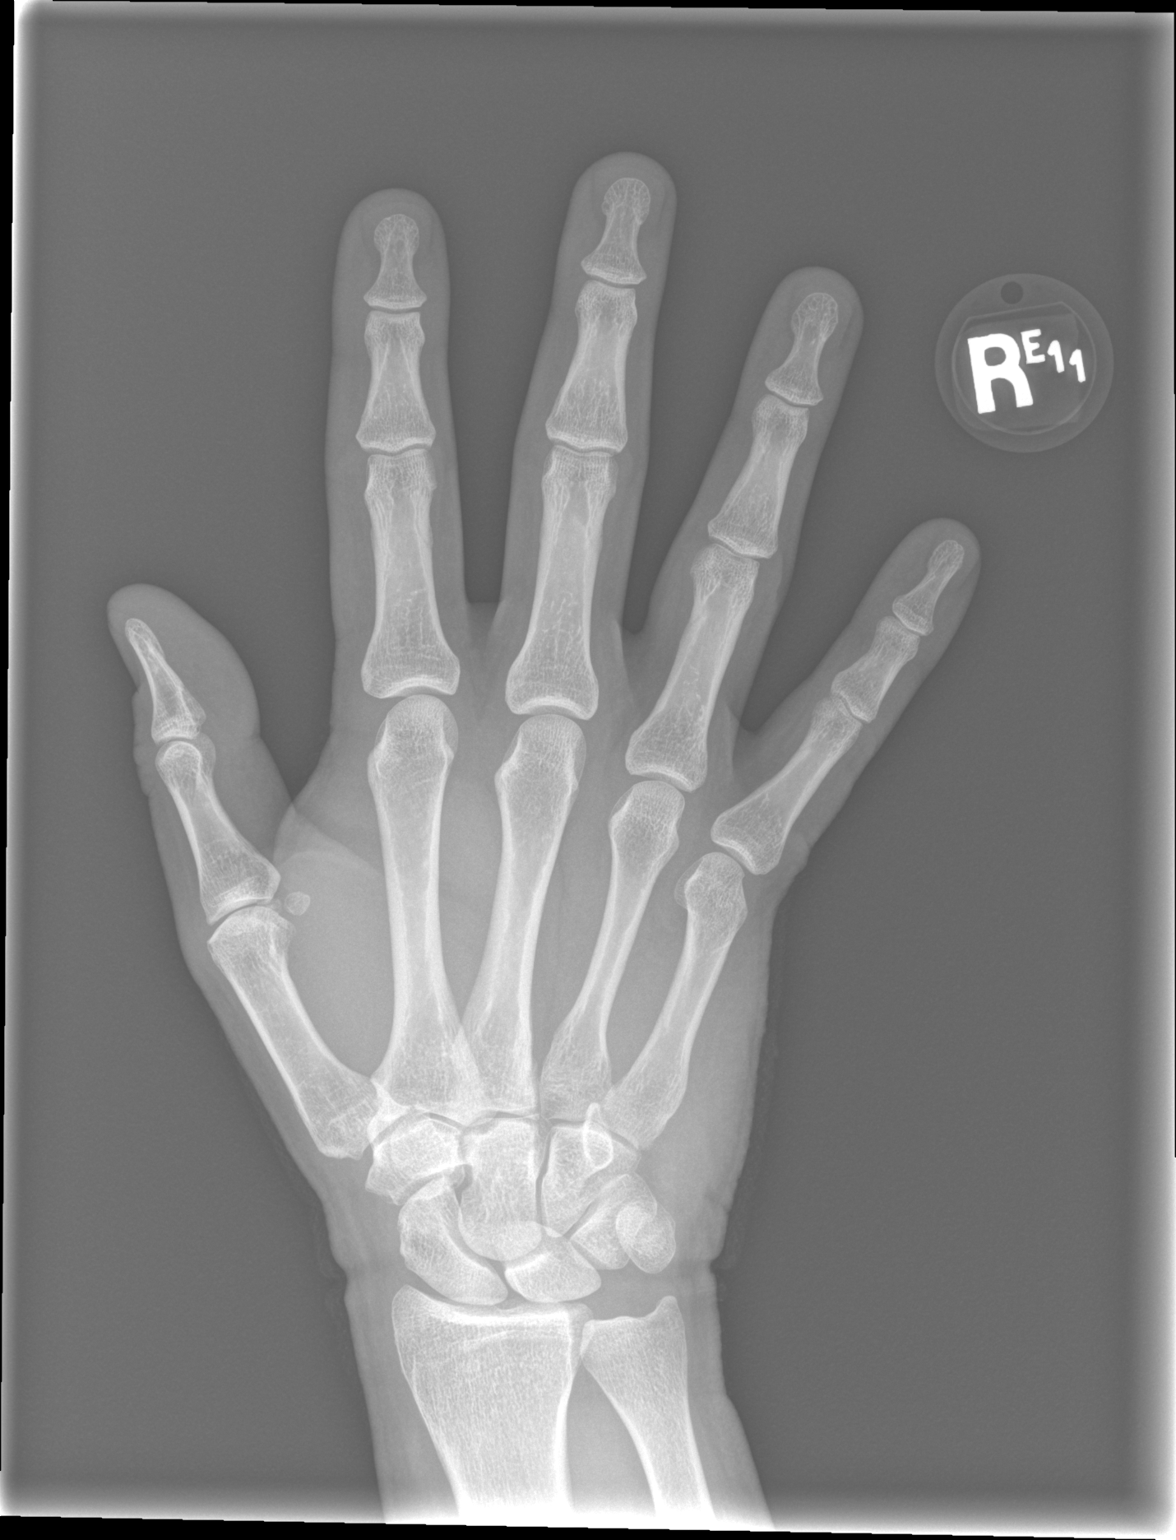

[x hand obl right]
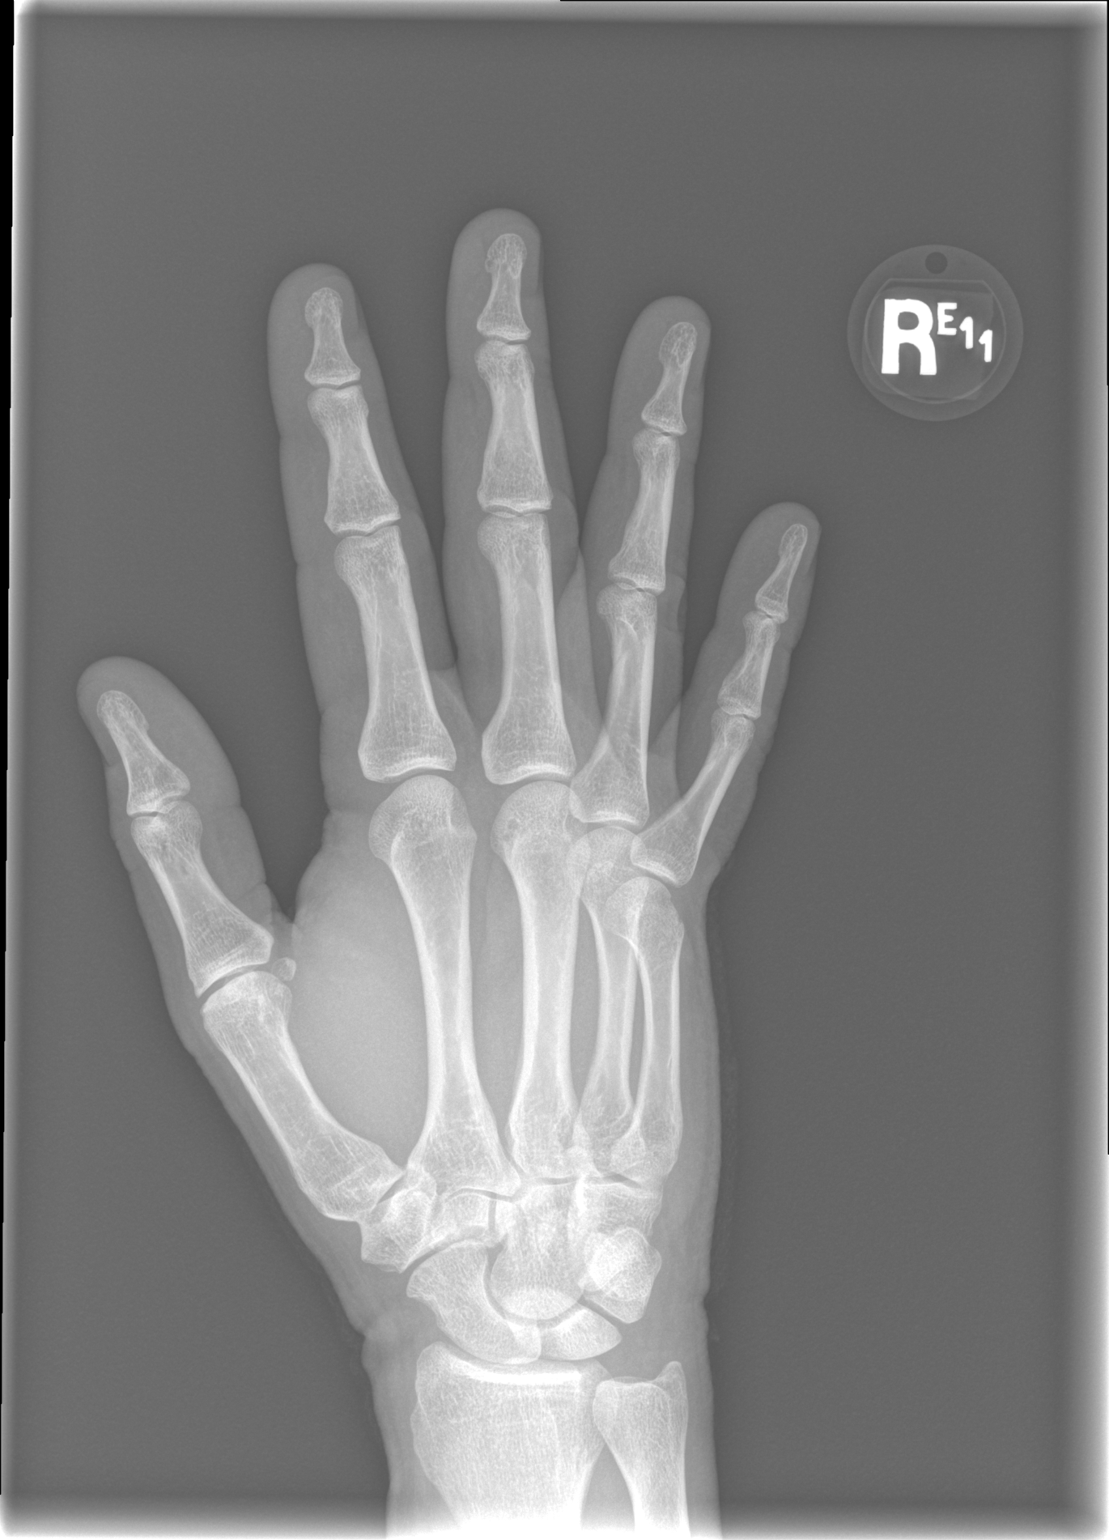

[x hand lat right]
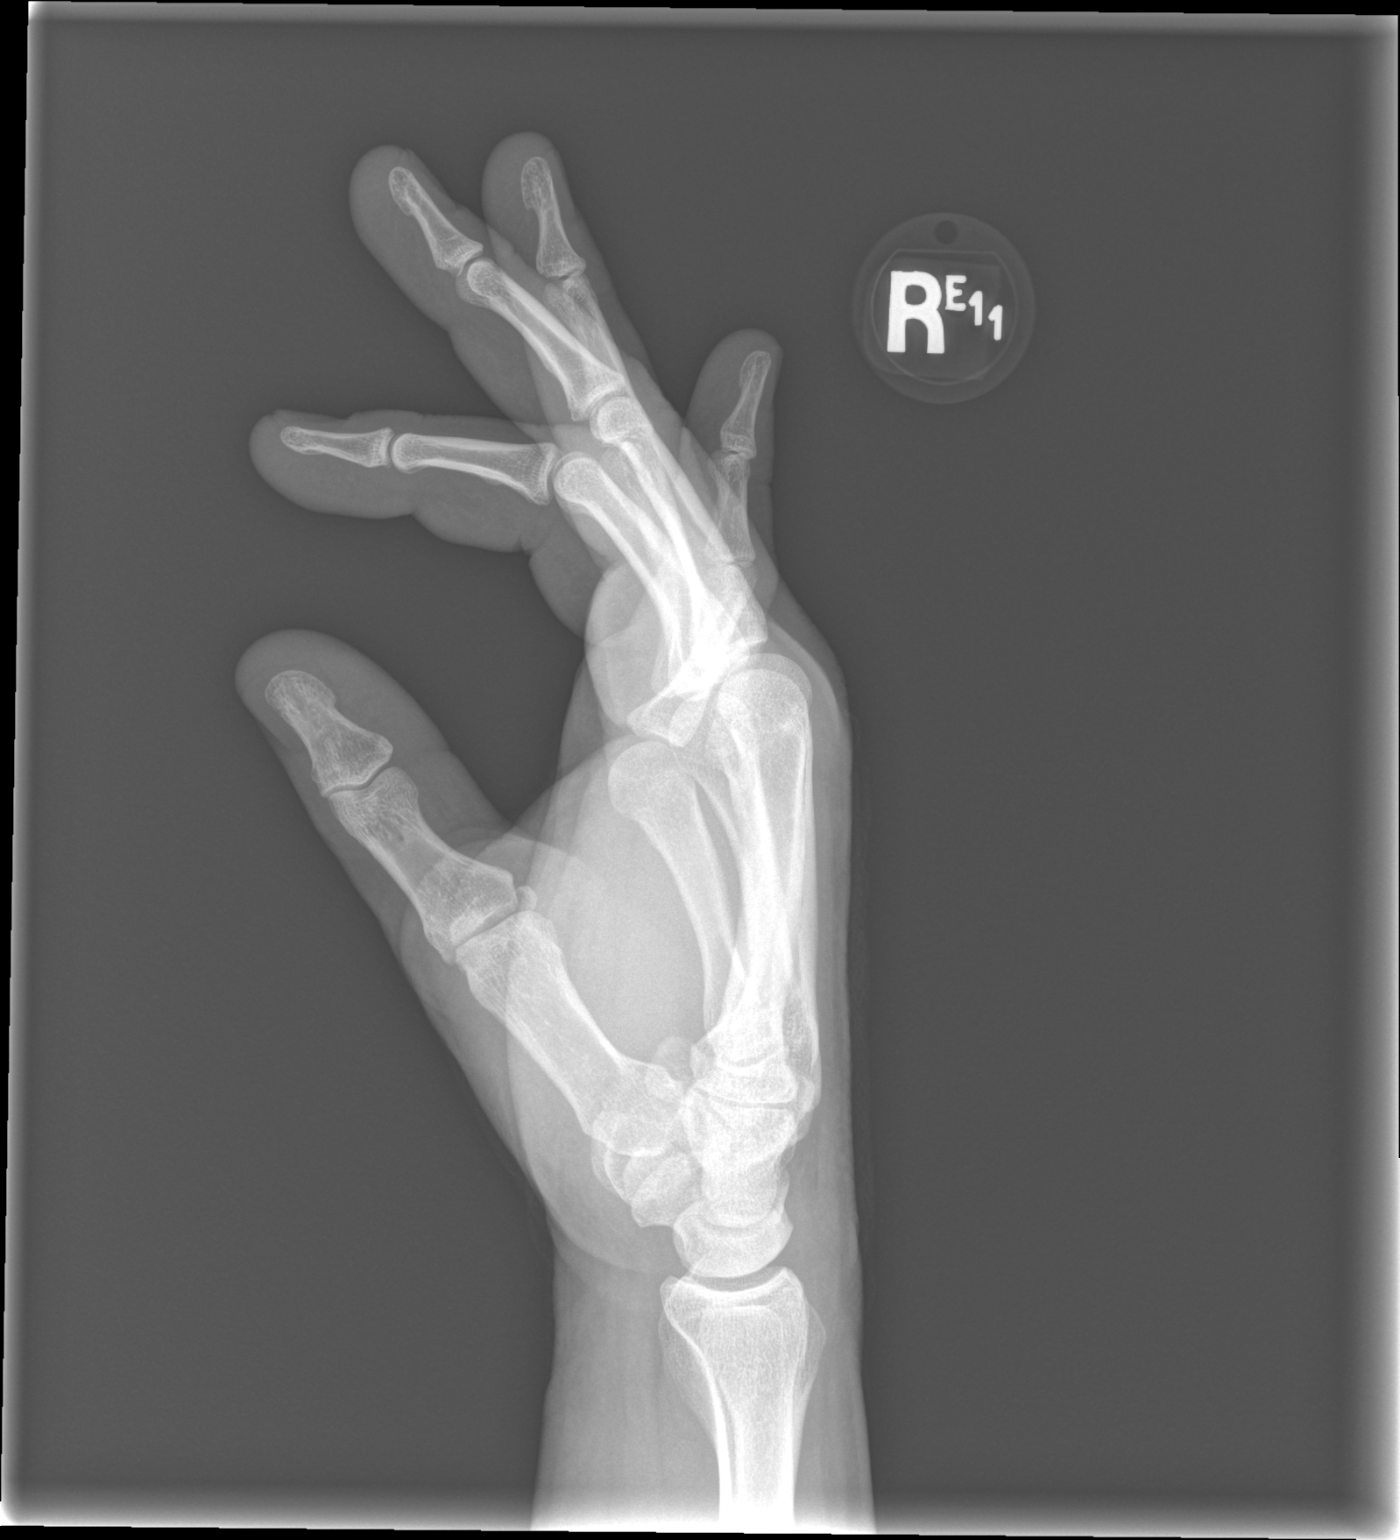

[3 of 3 positions shown; findings below may reference images not displayed]

FINDINGS: Osseous mineralization normal.

Joint spaces preserved.

Questionable nondisplaced fracture at distal fifth metacarpal.

No additional fracture, dislocation, or bone destruction.
IMPRESSION: Questionable nondisplaced oblique fracture at distal RIGHT fifth
metacarpal; correlation for pain/tenderness at this site
recommended.

No additional osseous abnormalities identified.

## 2022-12-22 ENCOUNTER — Encounter: Payer: Self-pay | Admitting: Radiology

## 2022-12-23 ENCOUNTER — Ambulatory Visit (INDEPENDENT_AMBULATORY_CARE_PROVIDER_SITE_OTHER): Payer: BC Managed Care – PPO | Admitting: Internal Medicine

## 2022-12-23 ENCOUNTER — Encounter: Payer: Self-pay | Admitting: Internal Medicine

## 2022-12-23 VITALS — BP 123/83 | HR 74 | Ht 76.0 in | Wt 201.0 lb

## 2022-12-23 DIAGNOSIS — E559 Vitamin D deficiency, unspecified: Secondary | ICD-10-CM | POA: Diagnosis not present

## 2022-12-23 DIAGNOSIS — E782 Mixed hyperlipidemia: Secondary | ICD-10-CM

## 2022-12-23 DIAGNOSIS — Z0001 Encounter for general adult medical examination with abnormal findings: Secondary | ICD-10-CM | POA: Diagnosis not present

## 2022-12-23 DIAGNOSIS — G250 Essential tremor: Secondary | ICD-10-CM

## 2022-12-23 DIAGNOSIS — R739 Hyperglycemia, unspecified: Secondary | ICD-10-CM

## 2022-12-23 NOTE — Assessment & Plan Note (Addendum)
Physical exam as documented. Fasting blood test today. Denies Tdap vaccine today.

## 2022-12-23 NOTE — Progress Notes (Signed)
Established Patient Office Visit  Subjective:  Patient ID: Kenneth Owens, male    DOB: 1980/11/27  Age: 42 y.o. MRN: [id number]  CC:  Chief Complaint  Patient presents with   Annual Exam    HPI Kenneth Owens is a 42 y.o. male with past medical history of HLD and tremors who presents for annual physical.  HLD: He has started taking Crestor and has been tolerating it well.  He did reports chronic fine tremors of the bilateral hands.  He states that he would have tremors when he is hungry and sometimes while doing fine work.  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  Denies any numbness, tingling or weakness of the UE.  He has history of chronic neck pain and has been told of DDD of cervical spine, for which he has seen chiropractor.     Past Medical History:  Diagnosis Date   Allergy    [facility]reesia 08/01/2020    Past Surgical History:  Procedure Laterality Date   EYE SURGERY N/A    [facility]ia 08/01/2020    History reviewed. No pertinent family history.  Social History   Socioeconomic History   Marital status: Married    Spouse name: Not on file   Number of children: Not on file   Years of education: Not on file   Highest education level: Not on file  Occupational History    Comment: [employer]  Tobacco Use   Smoking status: Never   Smokeless tobacco: Never  Substance and Sexual Activity   Alcohol use: Yes    Comment: at least twice in a week   Drug use: Never   Sexual activity: Never  Other Topics Concern   Not on file  Social History Narrative   Not on file   Social Determinants of Health   Financial Resource Strain: Not on file  Food Insecurity: Not on file  Transportation Needs: Not on file  Physical Activity: Not on file  Stress: Not on file  Social Connections: Not on file  Intimate Partner Violence: Not on file    Outpatient Medications Prior to Visit   Medication Sig Dispense Refill   fluticasone (FLONASE) 50 MCG/ACT nasal spray Place 1 spray into both nostrils as needed. (Patient not taking: Reported on 05/06/2022)     Multiple Vitamin (MULTIVITAMIN) tablet Take 1 tablet by mouth daily.     rosuvastatin (CRESTOR) 5 MG tablet Take 1 tablet (5 mg total) by mouth daily. 90 tablet 1   No facility-administered medications prior to visit.    Allergies  Allergen Reactions   Shellfish Allergy     ROS Review of Systems  Constitutional:  Negative for chills and fever.  HENT:  Negative for congestion, sinus pressure, sinus pain, sneezing and sore throat.   Eyes:  Negative for pain and discharge.  Respiratory:  Negative for cough and shortness of breath.   Cardiovascular:  Negative for chest pain and palpitations.  Gastrointestinal:  Negative for constipation, diarrhea, nausea and vomiting.  Endocrine: Negative for polydipsia and polyuria.  Genitourinary:  Negative for dysuria and hematuria.  Musculoskeletal:  Negative for neck pain and neck stiffness.  Skin:  Negative for rash.  Neurological:  Positive for tremors. Negative for dizziness, weakness, numbness and headaches.  Psychiatric/Behavioral:  Negative for agitation and behavioral problems.       Objective:    Physical Exam Vitals reviewed.  Constitutional:  General: He is not in acute distress.    Appearance: He is not diaphoretic.  HENT:     Head: Normocephalic and atraumatic.     Nose: Nose normal.     Mouth/Throat:     Mouth: Mucous membranes are moist.  Eyes:     General: No scleral icterus.    Extraocular Movements: Extraocular movements intact.     Pupils: Pupils are equal, round, and reactive to light.  Cardiovascular:     Rate and Rhythm: Normal rate and regular rhythm.     Pulses: Normal pulses.     Heart sounds: Normal heart sounds. No murmur heard. Pulmonary:     Breath sounds: Normal breath sounds. No wheezing or rales.  Abdominal:     Palpations:  Abdomen is soft.     Tenderness: There is no abdominal tenderness.  Musculoskeletal:     Cervical back: Neck supple. No tenderness.     Right lower leg: No edema.     Left lower leg: No edema.  Skin:    General: Skin is warm.     Findings: No rash.  Neurological:     General: No focal deficit present.     Mental Status: He is alert and oriented to person, place, and time.     Sensory: No sensory deficit.     Motor: No weakness.     Comments: Fine tremors of b/l hands  Psychiatric:        Mood and Affect: Mood normal.        Behavior: Behavior normal.     BP 123/83 (BP Location: Right Arm, Patient Position: Sitting, Cuff Size: Large)   Pulse 74   Ht '6\' 4"'$  (1.93 m)   Wt 201 lb (91.2 kg)   SpO2 98%   BMI 24.47 kg/m  Wt Readings from Last 3 Encounters:  12/23/22 201 lb (91.2 kg)  05/06/22 200 lb (90.7 kg)  09/15/21 201 lb (91.2 kg)    Lab Results  Component Value Date   TSH 1.640 05/06/2022   Lab Results  Component Value Date   WBC 5.2 05/06/2022   HGB 15.3 05/06/2022   HCT 46.3 05/06/2022   MCV 92 05/06/2022   PLT 222 05/06/2022   Lab Results  Component Value Date   NA CANCELED 05/06/2022   K CANCELED 05/06/2022   CO2 16 (L) 05/06/2022   GLUCOSE 85 05/06/2022   BUN 17 05/06/2022   CREATININE 1.04 05/06/2022   BILITOT 0.6 05/06/2022   ALKPHOS 97 05/06/2022   AST 35 05/06/2022   ALT 41 05/06/2022   PROT 7.4 05/06/2022   ALBUMIN 4.6 05/06/2022   CALCIUM 9.7 05/06/2022   EGFR 91 05/06/2022   Lab Results  Component Value Date   CHOL 252 (H) 05/06/2022   Lab Results  Component Value Date   HDL 60 05/06/2022   Lab Results  Component Value Date   LDLCALC 162 (H) 05/06/2022   Lab Results  Component Value Date   TRIG 168 (H) 05/06/2022   Lab Results  Component Value Date   CHOLHDL 4.2 05/06/2022   Lab Results  Component Value Date   HGBA1C 5.5 05/06/2022      Assessment & Plan:   Problem List Items Addressed This Visit       Nervous and  Auditory   Essential tremor    His fine tremors likely benign essential tremors Advised to maintain adequate hydration and eat at regular intervals Avoid caffeinated products Does not affect functional status  currently If worsens, will start propranolol and/or get neurology evaluation      Relevant Orders   CBC with Differential/Platelet   TSH     Other   Mixed hyperlipidemia    On Crestor 5 mg QD Had persistently elevated LDL despite low cholesterol diet Check lipid profile       Relevant Orders   CMP14+EGFR   Lipid Profile   Encounter for general adult medical examination with abnormal findings - Primary    Physical exam as documented. Fasting blood test today. Denies Tdap vaccine today.      Other Visit Diagnoses     Vitamin D deficiency       Relevant Orders   Vitamin D (25 hydroxy)   Hyperglycemia       Relevant Orders   Hemoglobin A1c       No orders of the defined types were placed in this encounter.   Follow-up: Return in about 1 year (around 12/23/2023) for Annual physical.    Lindell Spar, MD

## 2022-12-23 NOTE — Assessment & Plan Note (Signed)
His fine tremors likely benign essential tremors Advised to maintain adequate hydration and eat at regular intervals Avoid caffeinated products Does not affect functional status currently If worsens, will start propranolol and/or get neurology evaluation

## 2022-12-23 NOTE — Assessment & Plan Note (Signed)
On Crestor 5 mg QD Had persistently elevated LDL despite low cholesterol diet Check lipid profile

## 2022-12-23 NOTE — Patient Instructions (Signed)
Please continue taking medications as prescribed.  Please continue to follow low cholesterol diet and perform moderate exercise/walking at least 150 mins/week.

## 2022-12-24 LAB — HEMOGLOBIN A1C
Est. average glucose Bld gHb Est-mCnc: 111 mg/dL
Hgb A1c MFr Bld: 5.5 % (ref 4.8–5.6)

## 2022-12-24 LAB — CMP14+EGFR
ALT: 44 IU/L (ref 0–44)
AST: 31 IU/L (ref 0–40)
Albumin/Globulin Ratio: 2 (ref 1.2–2.2)
Albumin: 4.7 g/dL (ref 4.1–5.1)
Alkaline Phosphatase: 87 IU/L (ref 44–121)
BUN/Creatinine Ratio: 18 (ref 9–20)
BUN: 18 mg/dL (ref 6–24)
Bilirubin Total: 0.6 mg/dL (ref 0.0–1.2)
CO2: 23 mmol/L (ref 20–29)
Calcium: 9.8 mg/dL (ref 8.7–10.2)
Chloride: 105 mmol/L (ref 96–106)
Creatinine, Ser: 1.02 mg/dL (ref 0.76–1.27)
Globulin, Total: 2.3 g/dL (ref 1.5–4.5)
Glucose: 95 mg/dL (ref 70–99)
Potassium: 4.6 mmol/L (ref 3.5–5.2)
Sodium: 141 mmol/L (ref 134–144)
Total Protein: 7 g/dL (ref 6.0–8.5)
eGFR: 94 mL/min/{1.73_m2} (ref >59)

## 2022-12-24 LAB — CBC WITH DIFFERENTIAL/PLATELET
Basophils Absolute: 0.1 10*3/uL (ref 0.0–0.2)
Basos: 1 %
EOS (ABSOLUTE): 0.1 10*3/uL (ref 0.0–0.4)
Eos: 2 %
Hematocrit: 46.1 % (ref 37.5–51.0)
Hemoglobin: 15 g/dL (ref 13.0–17.7)
Immature Grans (Abs): 0 10*3/uL (ref 0.0–0.1)
Immature Granulocytes: 0 %
Lymphocytes Absolute: 1.7 10*3/uL (ref 0.7–3.1)
Lymphs: 34 %
MCH: 29.9 pg (ref 26.6–33.0)
MCHC: 32.5 g/dL (ref 31.5–35.7)
MCV: 92 fL (ref 79–97)
Monocytes Absolute: 0.6 10*3/uL (ref 0.1–0.9)
Monocytes: 11 %
Neutrophils Absolute: 2.6 10*3/uL (ref 1.4–7.0)
Neutrophils: 52 %
Platelets: 261 10*3/uL (ref 150–450)
RBC: 5.01 x10E6/uL (ref 4.14–5.80)
RDW: 12.3 % (ref 11.6–15.4)
WBC: 5 10*3/uL (ref 3.4–10.8)

## 2022-12-24 LAB — LIPID PANEL
Chol/HDL Ratio: 3.4 ratio (ref 0.0–5.0)
Cholesterol, Total: 205 mg/dL — ABNORMAL HIGH (ref 100–199)
HDL: 61 mg/dL (ref >39)
LDL Chol Calc (NIH): 125 mg/dL — ABNORMAL HIGH (ref 0–99)
Triglycerides: 105 mg/dL (ref 0–149)
VLDL Cholesterol Cal: 19 mg/dL (ref 5–40)

## 2022-12-24 LAB — VITAMIN D 25 HYDROXY (VIT D DEFICIENCY, FRACTURES): Vit D, 25-Hydroxy: 28.4 ng/mL — ABNORMAL LOW (ref 30.0–100.0)

## 2022-12-24 LAB — TSH: TSH: 1.33 u[IU]/mL (ref 0.450–4.500)

## 2022-12-26 ENCOUNTER — Other Ambulatory Visit: Payer: Self-pay | Admitting: Internal Medicine

## 2022-12-26 DIAGNOSIS — E782 Mixed hyperlipidemia: Secondary | ICD-10-CM

## 2022-12-26 MED ORDER — ROSUVASTATIN CALCIUM 10 MG PO TABS: 10.0000 mg | ORAL_TABLET | Freq: Every day | ORAL | 3 refills | Status: DC

## 2023-05-12 ENCOUNTER — Encounter: Payer: Self-pay | Admitting: Internal Medicine

## 2023-05-12 ENCOUNTER — Ambulatory Visit (INDEPENDENT_AMBULATORY_CARE_PROVIDER_SITE_OTHER): Payer: BC Managed Care – PPO | Admitting: Internal Medicine

## 2023-05-12 VITALS — BP 138/82 | HR 70 | Ht 76.0 in | Wt 200.2 lb

## 2023-05-12 DIAGNOSIS — R030 Elevated blood-pressure reading, without diagnosis of hypertension: Secondary | ICD-10-CM

## 2023-05-12 DIAGNOSIS — E782 Mixed hyperlipidemia: Secondary | ICD-10-CM | POA: Diagnosis not present

## 2023-05-12 DIAGNOSIS — G250 Essential tremor: Secondary | ICD-10-CM

## 2023-05-12 NOTE — Patient Instructions (Signed)
Please stop taking Crestor.  Please take CoQ10 - 50 mg once daily for leg cramps.  Please continue to follow low salt and low cholesterol diet and perform moderate exercise/walking at least 150 mins/week.

## 2023-05-12 NOTE — Assessment & Plan Note (Signed)
His fine tremors likely benign essential tremors Advised to maintain adequate hydration and eat at regular intervals Avoid caffeinated products Does not affect functional status currently If worsens, will start propranolol and/or get neurology evaluation

## 2023-05-12 NOTE — Progress Notes (Signed)
Established Patient Office Visit  Subjective:  Patient ID: Kenneth Owens, male    DOB: Apr 11, 1981  Age: 42 y.o. MRN: [id number]  CC:  Chief Complaint  Patient presents with   Annual Exam    HPI Kenneth Owens is a 42 y.o. male with past medical history of HLD and tremors who presents for f/u of his chronic medical conditions.  HLD: He has started taking Crestor, but has severe leg cramps, fatigue and brain fog with it. He does not have h/o type 2 DM, CVA or CAD. BP is wnl.  He did report chronic fine tremors of the bilateral hands.  He states that he would have tremors when he is hungry and sometimes while doing fine work.  He currently works as a Hydrologist, but denies any functional disturbance due to tremors with his day-to-day activity.  Denies any numbness, tingling or weakness of the UE.  He has history of chronic neck pain and has been told of DDD of cervical spine, for which he has seen chiropractor.   Past Medical History:  Diagnosis Date   Allergy    Phreesia 08/01/2020    Past Surgical History:  Procedure Laterality Date   EYE SURGERY N/A    [facility]reesia 08/01/2020    History reviewed. No pertinent family history.  Social History   Socioeconomic History   Marital status: Married    Spouse name: Not on file   Number of children: Not on file   Years of education: Not on file   Highest education level: Not on file  Occupational History    Comment: [employer]  Tobacco Use   Smoking status: Never   Smokeless tobacco: Never  Substance and Sexual Activity   Alcohol use: Yes    Comment: at least twice in a week   Drug use: Never   Sexual activity: Never  Other Topics Concern   Not on file  Social History Narrative   Not on file   Social Determinants of Health   Financial Resource Strain: Not on file  Food Insecurity: Not on file  Transportation Needs: Not on file  Physical Activity: Not on file  Stress: Not on file  Social Connections:  Not on file  Intimate Partner Violence: Not on file    Outpatient Medications Prior to Visit  Medication Sig Dispense Refill   Multiple Vitamin (MULTIVITAMIN) tablet Take 1 tablet by mouth daily.     rosuvastatin (CRESTOR) 10 MG tablet Take 1 tablet (10 mg total) by mouth daily. 90 tablet 3   No facility-administered medications prior to visit.    Allergies  Allergen Reactions   Shellfish Allergy     ROS Review of Systems  Constitutional:  Positive for fatigue. Negative for chills and fever.  HENT:  Negative for congestion, sinus pressure, sinus pain, sneezing and sore throat.   Eyes:  Negative for pain and discharge.  Respiratory:  Negative for cough and shortness of breath.   Cardiovascular:  Negative for chest pain and palpitations.  Gastrointestinal:  Negative for constipation, diarrhea, nausea and vomiting.  Endocrine: Negative for polydipsia and polyuria.  Genitourinary:  Negative for dysuria and hematuria.  Musculoskeletal:  Negative for neck pain and neck stiffness.  Skin:  Negative for rash.  Neurological:  Positive for tremors. Negative for dizziness, weakness, numbness and headaches.  Psychiatric/Behavioral:  Negative for agitation and behavioral problems.       Objective:    Physical Exam Vitals reviewed.  Constitutional:  General: He is not in acute distress.    Appearance: He is not diaphoretic.  HENT:     Head: Normocephalic and atraumatic.     Nose: Nose normal.     Mouth/Throat:     Mouth: Mucous membranes are moist.  Eyes:     General: No scleral icterus.    Extraocular Movements: Extraocular movements intact.  Cardiovascular:     Rate and Rhythm: Normal rate and regular rhythm.     Pulses: Normal pulses.     Heart sounds: Normal heart sounds. No murmur heard. Pulmonary:     Breath sounds: Normal breath sounds. No wheezing or rales.  Musculoskeletal:     Cervical back: Neck supple. No tenderness.     Right lower leg: No edema.     Left  lower leg: No edema.  Skin:    General: Skin is warm.     Findings: No rash.  Neurological:     General: No focal deficit present.     Mental Status: He is alert and oriented to person, place, and time.     Sensory: No sensory deficit.     Motor: No weakness.     Comments: Fine tremors of b/l hands  Psychiatric:        Mood and Affect: Mood normal.        Behavior: Behavior normal.     BP 138/82 (BP Location: Right Arm, Patient Position: Sitting, Cuff Size: Large)   Pulse 70   Ht 6\' 4"  (1.93 m)   Wt 200 lb 3.2 oz (90.8 kg)   SpO2 97%   BMI 24.37 kg/m  Wt Readings from Last 3 Encounters:  05/12/23 200 lb 3.2 oz (90.8 kg)  12/23/22 201 lb (91.2 kg)  05/06/22 200 lb (90.7 kg)    Lab Results  Component Value Date   TSH 1.330 12/23/2022   Lab Results  Component Value Date   WBC 5.0 12/23/2022   HGB 15.0 12/23/2022   HCT 46.1 12/23/2022   MCV 92 12/23/2022   PLT 261 12/23/2022   Lab Results  Component Value Date   NA 141 12/23/2022   K 4.6 12/23/2022   CO2 23 12/23/2022   GLUCOSE 95 12/23/2022   BUN 18 12/23/2022   CREATININE 1.02 12/23/2022   BILITOT 0.6 12/23/2022   ALKPHOS 87 12/23/2022   AST 31 12/23/2022   ALT 44 12/23/2022   PROT 7.0 12/23/2022   ALBUMIN 4.7 12/23/2022   CALCIUM 9.8 12/23/2022   EGFR 94 12/23/2022   Lab Results  Component Value Date   CHOL 205 (H) 12/23/2022   Lab Results  Component Value Date   HDL 61 12/23/2022   Lab Results  Component Value Date   LDLCALC 125 (H) 12/23/2022   Lab Results  Component Value Date   TRIG 105 12/23/2022   Lab Results  Component Value Date   CHOLHDL 3.4 12/23/2022   Lab Results  Component Value Date   HGBA1C 5.5 12/23/2022      Assessment & Plan:   Problem List Items Addressed This Visit       Nervous and Auditory   Essential tremor    His fine tremors likely benign essential tremors Advised to maintain adequate hydration and eat at regular intervals Avoid caffeinated  products Does not affect functional status currently If worsens, will start propranolol and/or get neurology evaluation        Other   Prehypertension    BP Readings from Last 1 Encounters:  05/12/23 138/82   Well-controlled with low salt diet Advised DASH diet and moderate exercise/walking, at least 150 mins/week       Mixed hyperlipidemia - Primary    On Crestor 10 mg every day, but has severe leg cramps and fatigue Had persistently elevated LDL despite low cholesterol diet Check lipid profile Due to his low risk profile and healthy lifestyle, discontinued statin after discussion - offered every other day dosing with CoQ10 supplement, he prefers to avoid statin for now       Relevant Orders   Lipid Profile   CMP14+EGFR    No orders of the defined types were placed in this encounter.   Follow-up: No follow-ups on file.    Anabel Halon, MD

## 2023-05-12 NOTE — Assessment & Plan Note (Signed)
On Crestor 10 mg every day, but has severe leg cramps and fatigue Had persistently elevated LDL despite low cholesterol diet Check lipid profile Due to his low risk profile and healthy lifestyle, discontinued statin after discussion - offered every other day dosing with CoQ10 supplement, he prefers to avoid statin for now

## 2023-05-12 NOTE — Assessment & Plan Note (Signed)
BP Readings from Last 1 Encounters:  05/12/23 138/82   Well-controlled with low salt diet Advised DASH diet and moderate exercise/walking, at least 150 mins/week

## 2023-05-13 LAB — CMP14+EGFR
ALT: 52 IU/L — ABNORMAL HIGH (ref 0–44)
AST: 37 IU/L (ref 0–40)
Albumin: 4.7 g/dL (ref 4.1–5.1)
Alkaline Phosphatase: 91 IU/L (ref 44–121)
BUN/Creatinine Ratio: 18 (ref 9–20)
BUN: 18 mg/dL (ref 6–24)
Bilirubin Total: 0.6 mg/dL (ref 0.0–1.2)
CO2: 23 mmol/L (ref 20–29)
Calcium: 9.7 mg/dL (ref 8.7–10.2)
Chloride: 102 mmol/L (ref 96–106)
Creatinine, Ser: 1.02 mg/dL (ref 0.76–1.27)
Globulin, Total: 2.5 g/dL (ref 1.5–4.5)
Glucose: 84 mg/dL (ref 70–99)
Potassium: 4.8 mmol/L (ref 3.5–5.2)
Sodium: 140 mmol/L (ref 134–144)
Total Protein: 7.2 g/dL (ref 6.0–8.5)
eGFR: 93 mL/min/{1.73_m2} (ref >59)

## 2023-05-13 LAB — LIPID PANEL
Chol/HDL Ratio: 3.7 ratio (ref 0.0–5.0)
Cholesterol, Total: 223 mg/dL — ABNORMAL HIGH (ref 100–199)
HDL: 60 mg/dL (ref >39)
LDL Chol Calc (NIH): 145 mg/dL — ABNORMAL HIGH (ref 0–99)
Triglycerides: 103 mg/dL (ref 0–149)
VLDL Cholesterol Cal: 18 mg/dL (ref 5–40)

## 2023-07-28 ENCOUNTER — Telehealth: Payer: Self-pay | Admitting: Internal Medicine

## 2023-07-28 NOTE — Telephone Encounter (Signed)
Health form for work  Noted Copied Sleeved (put in provider box) Call patient when form is ready

## 2023-08-01 NOTE — Telephone Encounter (Signed)
Document filled out Doctor signed Faxed per paperwork  Copy made Faxed to charge que Left voicemail for patient

## 2023-10-23 ENCOUNTER — Ambulatory Visit (INDEPENDENT_AMBULATORY_CARE_PROVIDER_SITE_OTHER): Payer: BC Managed Care – PPO | Admitting: Urgent Care

## 2023-10-23 ENCOUNTER — Ambulatory Visit: Payer: Self-pay | Admitting: Internal Medicine

## 2023-10-23 VITALS — BP 148/82 | HR 95 | Temp 98.0°F | Wt 195.0 lb

## 2023-10-23 DIAGNOSIS — R1032 Left lower quadrant pain: Secondary | ICD-10-CM | POA: Diagnosis not present

## 2023-10-23 MED ORDER — AMOXICILLIN-POT CLAVULANATE 875-125 MG PO TABS: 1.0000 | ORAL_TABLET | Freq: Two times a day (BID) | ORAL | 0 refills | Status: AC

## 2023-10-23 NOTE — Patient Instructions (Addendum)
Your symptoms are most consistent with diverticulitis.  Please take the Augmentin, an antibiotic, twice daily for the next 14 days. Please take with food.  Your leg and testicular symptoms should start improving rapidly if related to peritoneal irritation/ inflammation from diverticulitis. If you have a small hernia, there will be no improvement therefore please notify us immediately should your symptoms fail to improve.  Please drink plenty of water while on the medication.  If you have any new or worsening abdominal or testicular pain, develop a fever, or develop a bulge in the abdomen, please notify us immediately or go to the ER.  Please schedule a follow up with your PCP in 10 days. If they cannot accommodate this request, please return here for recheck.

## 2023-10-23 NOTE — Progress Notes (Signed)
New Patient Office Visit  Subjective:  Patient ID: Kenneth Owens, male    DOB: [birth year]  Age: 42 y.o. MRN: [id number]  CC:  Chief Complaint  Patient presents with   Abdominal Pain    Pt felt like he pulled a muscle about a month ago and was having left sided abdominal pain. Now he has testicle pain that radiates down his leg    Discussed the use of AI software for clinical note transcription with the patient who gave verbal consent to proceed.   HPI Kenneth Owens presents for evaluation of LLQ pain.  History of Present Illness The patient, with a history of diverticulitis 5 years ago, presents with a two-month history of abdominal pain that began after a presumed muscle strain while playing golf. Initially, the patient experienced difficulty raising his arm due to the pain, which gradually improved over a couple of weeks. However, a lingering, radiating pain persisted in the abdominal region. Over the past three to four days, the patient reports an escalation of pain, now involving the left testicle and radiating down the left leg to the knee. The pain is described as throbbing and sometimes stabbing.  The patient did not seek medical attention initially, instead opting for self-management with a waistband stabilizer for a week and gentle stretching. No over-the-counter pain medications were used. The patient denies any scrotal swelling, discharge, or concerns for sexually transmitted diseases. There is no reported change in bowel or bladder habits, and no pain or blood in the urine.  The patient has a history of diverticulitis, with an episode occurring in [place] in 2019, which was managed with antibiotics. The patient denies any similarity in the current pain to the previous diverticulitis episode. The patient is a non-smoker and reports being relatively active. He is not currently on any prescription medications and takes a multivitamin.   Outpatient Encounter Medications as  of 10/23/2023  Medication Sig   amoxicillin-clavulanate (AUGMENTIN) 875-125 MG tablet Take 1 tablet by mouth 2 (two) times daily with a meal for 14 days.   Multiple Vitamin (MULTIVITAMIN) tablet Take 1 tablet by mouth daily.   No facility-administered encounter medications on file as of 10/23/2023.    Past Medical History:  Diagnosis Date   Allergy    [facility]reesia 08/01/2020    Past Surgical History:  Procedure Laterality Date   EYE SURGERY N/A    [facility]ia 08/01/2020    History reviewed. No pertinent family history.  Social History   Socioeconomic History   Marital status: Married    Spouse name: Not on file   Number of children: Not on file   Years of education: Not on file   Highest education level: Master's degree (e.g., MA, MS, MEng, MEd, MSW, MBA)  Occupational History    Comment: [employer]  Tobacco Use   Smoking status: Never   Smokeless tobacco: Never  Substance and Sexual Activity   Alcohol use: Yes    Comment: at least twice in a week   Drug use: Never   Sexual activity: Never  Other Topics Concern   Not on file  Social History Narrative   Not on file   Social Drivers of Health   Financial Resource Strain: Low Risk  (10/23/2023)   Overall Financial Resource Strain (CARDIA)    Difficulty of Paying Living Expenses: Not hard at all  Food Insecurity: No Food Insecurity (10/23/2023)   Hunger Vital Sign    Worried About Running Out of Food in the Last  Year: Never true    Ran Out of Food in the Last Year: Never true  Transportation Needs: No Transportation Needs (10/23/2023)   PRAPARE - Administrator, Civil Service (Medical): No    Lack of Transportation (Non-Medical): No  Physical Activity: Insufficiently Active (10/23/2023)   Exercise Vital Sign    Days of Exercise per Week: 4 days    Minutes of Exercise per Session: 30 min  Stress: No Stress Concern Present (10/23/2023)   [employer] of Occupational Health - Occupational Stress  Questionnaire    Feeling of Stress : Only a little  Social Connections: Socially Integrated (10/23/2023)   Social Connection and Isolation Panel [NHANES]    Frequency of Communication with Friends and Family: Three times a week    Frequency of Social Gatherings with Friends and Family: Twice a week    Attends Religious Services: More than 4 times per year    Active Member of [employer] or Organizations: Yes    Attends Engineer, structural: More than 4 times per year    Marital Status: Married  Catering manager Violence: Not on file    ROS: as noted in HPI  Objective:  BP (!) 148/82   Pulse 95   Temp 98 F (36.7 C) (Oral)   Wt 195 lb (88.5 kg)   SpO2 98%   BMI 23.74 kg/m   Physical Exam Vitals and nursing note reviewed.  Constitutional:      General: He is not in acute distress.    Appearance: He is well-developed and normal weight. He is not ill-appearing, toxic-appearing or diaphoretic.  HENT:     Head: Normocephalic and atraumatic.     Mouth/Throat:     Mouth: Mucous membranes are moist.  Eyes:     General: No scleral icterus.    Extraocular Movements: Extraocular movements intact.     Pupils: Pupils are equal, round, and reactive to light.  Cardiovascular:     Rate and Rhythm: Normal rate and regular rhythm.  Pulmonary:     Effort: Pulmonary effort is normal. No respiratory distress.     Breath sounds: Normal breath sounds.  Abdominal:     General: Abdomen is flat. Bowel sounds are normal. There is no distension or abdominal bruit. There are no signs of injury.     Palpations: Abdomen is soft. There is no fluid wave, hepatomegaly, splenomegaly or mass.     Tenderness: There is abdominal tenderness (present, but decreased bowel sounds to LLQ, no guarding, rigidity or signs of peritonitis) in the left lower quadrant. There is no right CVA tenderness, left CVA tenderness, guarding or rebound. Negative signs include psoas sign.     Hernia: No hernia is present.     Genitourinary:    Comments: GU exam offered and deferred Musculoskeletal:     Right upper leg: Normal.     Left upper leg: Normal.     Right lower leg: Normal. No swelling. No edema.     Left lower leg: Normal. No swelling. No edema.     Right foot: Normal.     Left foot: Normal.  Skin:    General: Skin is warm and dry.     Coloration: Skin is not cyanotic, jaundiced or pale.     Findings: No erythema.  Neurological:     General: No focal deficit present.     Mental Status: He is alert and oriented to person, place, and time.     Cranial Nerves: No  cranial nerve deficit.     Sensory: No sensory deficit.     Motor: No weakness.     Gait: Gait normal.     Last CBC Lab Results  Component Value Date   WBC 5.0 12/23/2022   HGB 15.0 12/23/2022   HCT 46.1 12/23/2022   MCV 92 12/23/2022   MCH 29.9 12/23/2022   RDW 12.3 12/23/2022   PLT 261 12/23/2022   Last metabolic panel Lab Results  Component Value Date   GLUCOSE 84 05/12/2023   NA 140 05/12/2023   K 4.8 05/12/2023   CL 102 05/12/2023   CO2 23 05/12/2023   BUN 18 05/12/2023   CREATININE 1.02 05/12/2023   EGFR 93 05/12/2023   CALCIUM 9.7 05/12/2023   PROT 7.2 05/12/2023   ALBUMIN 4.7 05/12/2023   LABGLOB 2.5 05/12/2023   AGRATIO 2.0 12/23/2022   BILITOT 0.6 05/12/2023   ALKPHOS 91 05/12/2023   AST 37 05/12/2023   ALT 52 (H) 05/12/2023      Assessment & Plan:  LLQ pain -     Amoxicillin-Pot Clavulanate; Take 1 tablet by mouth 2 (two) times daily with a meal for 14 days.  Dispense: 28 tablet; Refill: 0  Assessment and Plan Abdominal Pain with Radiation to Left Testicle and Leg   History of abdominal muscle strain 2 months ago with recent worsening of pain and radiation to left testicle and leg. No signs of testicular pathology or deep vein thrombosis. No bulge to suggest hernia. Pt denies sx concerning for STI, no risk factors for such. History of diverticulitis. Physical exam reveals tenderness in the  sigmoid colon region.   -Start empiric treatment with Augmentin 875mg  twice daily for 14 days, with the option to stop after 10 days if symptoms resolve.   -Monitor for worsening pain, changes in bowel movements, or development of fever.   -Schedule follow-up appointment with primary care provider in 10 days.   -Advise patient to contact provider if symptoms do not improve in 2-3 days or if he worsens.    History of Diverticulitis   Last episode in 2019, treated successfully with antibiotics. No recent changes in bowel habits or other symptoms suggestive of a flare.   -Continue monitoring.   -Advise patient to avoid known triggers, such as sunflower seeds.  No follow-ups on file.   Maretta Bees, PA

## 2023-10-23 NOTE — Telephone Encounter (Signed)
Copied from CRM [id number]. Topic: Clinical - Red Word Triage >> Oct 23, 2023  9:31 AM Ivette P wrote: Red Word that prompted transfer to Nurse Triage: Severe Pain. Early nov pt pulled abdomen muscle, pain goes down crotch area to left knee    Chief Complaint: LLQ pain and L scrotal pain Symptoms: sharp pain in scrotum Frequency: constant pain, intermittent pain levels Pertinent Negatives: Patient denies swelling or discoloraton Disposition: [] ED /[] Urgent Care (no appt availability in office) / [x] Appointment(In office/virtual)/ []  [facility]/ [] Home Care/ [] Refused Recommended Disposition /[] [facility]/ []  Follow-up with PCP Additional Notes: Pt sts that he pulled an abdominal muscle in November, sts he is still healing from that but has not worsening pain in LLQ with pain radiating into his testicle and down leg. Pt denies swelling or discoloration; denies trouble or changes with urination. No available appts at [facility], pt declines going to urgent care, prefers visit with another clinic. Scheduled for today at [facility] at 2p   Reason for Disposition  [1] Pain comes and goes (intermittent) AND [2] present > 24 hours  [1] Brief pain in scrotum or testicle AND [2] present < 1 hour AND [3] recurrent  (NO swelling)  Answer Assessment - Initial Assessment Questions 1. LOCATION and RADIATION: "Where is the pain located?"      Left testicle  2. QUALITY: "What does the pain feel like?"  (e.g., sharp, dull, aching, burning)     Sharp pain  3. SEVERITY: "How bad is the pain?"  (Scale 1-10; or mild, moderate, severe)   - MILD (1-3): doesn't interfere with normal activities    - MODERATE (4-7): interferes with normal activities (e.g., work or school) or awakens from sleep   - SEVERE (8-10): excruciating pain, unable to do any normal activities, difficulty walking     4/10 pain level  4. ONSET: "When did the pain start?"     Started within the last week  5. PATTERN:  "Does it come and go, or has it been constant since it started?"     [place] and goes, feels like the pain migrates and intensifies in certain area  6. SCROTAL APPEARANCE: "What does the scrotum look like?" "Is there any swelling or redness?"      No redness or swelling  7. HERNIA: "Has a doctor ever told you that you have a hernia?"     Has not been diagnosed  8. OTHER SYMPTOMS: "Do you have any other symptoms?" (e.g., fever, abdominal pain, vomiting, difficulty passing urine)     Abdominal pain (LLQ)  Protocols used: Scrotal Pain-A-AH

## 2023-11-08 ENCOUNTER — Other Ambulatory Visit: Payer: Self-pay | Admitting: Internal Medicine

## 2023-11-08 DIAGNOSIS — R1032 Left lower quadrant pain: Secondary | ICD-10-CM

## 2023-11-08 NOTE — Telephone Encounter (Signed)
Copied from CRM [id number]. Topic: Clinical - Medical Advice >> Nov 08, 2023 10:05 AM Hector Shade B wrote: Reason for CRM: Patient is concerned about the antibiotics he stated he has been on them for quite a while and has gotten worse please call him at [phone]

## 2023-11-09 ENCOUNTER — Telehealth: Payer: Self-pay | Admitting: Internal Medicine

## 2023-11-09 NOTE — Telephone Encounter (Signed)
Copied from CRM [id number]. Topic: General - Other >> Nov 09, 2023  8:50 AM Donita Brooks wrote: Reason for CRM: pt spoke with Dr. Allena Katz yesterday and Dr. Allena Katz order a CT for pt. Pt hasn't heard anything about scheduling his appointment for CT. Pt was inform by Dr. Allena Katz that it was a rush put on the CT.

## 2023-11-10 ENCOUNTER — Ambulatory Visit
Admission: RE | Admit: 2023-11-10 | Discharge: 2023-11-10 | Disposition: A | Discharge status: Home / Self Care | Payer: BC Managed Care – PPO | Source: Ambulatory Visit | Attending: Internal Medicine | Admitting: Internal Medicine

## 2023-11-10 ENCOUNTER — Ambulatory Visit: Payer: BC Managed Care – PPO | Admitting: Family Medicine

## 2023-11-10 DIAGNOSIS — R1032 Left lower quadrant pain: Secondary | ICD-10-CM

## 2023-11-13 NOTE — Telephone Encounter (Signed)
Scheduled 01.24.2025 with Dr patel 9:00 am.

## 2023-11-17 ENCOUNTER — Ambulatory Visit (INDEPENDENT_AMBULATORY_CARE_PROVIDER_SITE_OTHER): Payer: BC Managed Care – PPO | Admitting: Internal Medicine

## 2023-11-17 ENCOUNTER — Encounter: Payer: Self-pay | Admitting: Internal Medicine

## 2023-11-17 VITALS — BP 128/88 | HR 99 | Ht 76.0 in | Wt 201.4 lb

## 2023-11-17 DIAGNOSIS — S76012A Strain of muscle, fascia and tendon of left hip, initial encounter: Secondary | ICD-10-CM | POA: Insufficient documentation

## 2023-11-17 MED ORDER — CYCLOBENZAPRINE HCL 5 MG PO TABS: 5.0000 mg | ORAL_TABLET | Freq: Two times a day (BID) | ORAL | 0 refills | Status: DC | PRN

## 2023-11-17 NOTE — Progress Notes (Signed)
Acute Office Visit  Subjective:    Patient ID: Kenneth Owens, male    DOB: 1981-02-23, 43 y.o.   MRN: [id number]  Chief Complaint  Patient presents with   Abdominal Pain    Pt reports abdominal pain ongoing for months, still not better.    HPI Patient is in today for complaint of left-sided pelvic pain since 08/23/23.  His pain is intermittent, worse with lying down, which started after presumed muscle strain while playing golf.  His pain radiates towards groin area at times.  He was evaluated by another provider, and was told of diverticulitis, has completed Augmentin without any relief.  He had CT of the abdomen, which was also unremarkable.  He was concerned about hernia, but does not have a bulging currently.  Past Medical History:  Diagnosis Date   Allergy    [employer] 08/01/2020    Past Surgical History:  Procedure Laterality Date   EYE SURGERY N/A    [facility] 08/01/2020    History reviewed. No pertinent family history.  Social History   Socioeconomic History   Marital status: Married    Spouse name: Not on file   Number of children: Not on file   Years of education: Not on file   Highest education level: Master's degree (e.g., MA, MS, MEng, MEd, MSW, MBA)  Occupational History    Comment: [employer]  Tobacco Use   Smoking status: Never   Smokeless tobacco: Never  Substance and Sexual Activity   Alcohol use: Yes    Comment: at least twice in a week   Drug use: Never   Sexual activity: Never  Other Topics Concern   Not on file  Social History Narrative   Not on file   Social Drivers of Health   Financial Resource Strain: Low Risk  (10/23/2023)   Overall Financial Resource Strain (CARDIA)    Difficulty of Paying Living Expenses: Not hard at all  Food Insecurity: No Food Insecurity (10/23/2023)   Hunger Vital Sign    Worried About Running Out of Food in the Last Year: Never true    Ran Out of Food in the Last Year: Never true  Transportation Needs: No  Transportation Needs (10/23/2023)   PRAPARE - Administrator, Civil Service (Medical): No    Lack of Transportation (Non-Medical): No  Physical Activity: Insufficiently Active (10/23/2023)   Exercise Vital Sign    Days of Exercise per Week: 4 days    Minutes of Exercise per Session: 30 min  Stress: No Stress Concern Present (10/23/2023)   [employer] of Occupational Health - Occupational Stress Questionnaire    Feeling of Stress : Only a little  Social Connections: Socially Integrated (10/23/2023)   Social Connection and Isolation Panel [NHANES]    Frequency of Communication with Friends and Family: Three times a week    Frequency of Social Gatherings with Friends and Family: Twice a week    Attends Religious Services: More than 4 times per year    Active Member of [employer] or Organizations: Yes    Attends Engineer, structural: More than 4 times per year    Marital Status: Married  Catering manager Violence: Not on file    Outpatient Medications Prior to Visit  Medication Sig Dispense Refill   Multiple Vitamin (MULTIVITAMIN) tablet Take 1 tablet by mouth daily.     No facility-administered medications prior to visit.    Allergies  Allergen Reactions   Shellfish Allergy  Review of Systems  Constitutional:  Positive for fatigue. Negative for chills and fever.  HENT:  Negative for congestion, sinus pressure, sinus pain, sneezing and sore throat.   Eyes:  Negative for pain and discharge.  Respiratory:  Negative for cough and shortness of breath.   Cardiovascular:  Negative for chest pain and palpitations.  Gastrointestinal:  Negative for diarrhea, nausea and vomiting.  Endocrine: Negative for polydipsia and polyuria.  Genitourinary:  Negative for dysuria and hematuria.  Musculoskeletal:  Negative for neck pain and neck stiffness.       Left pelvic pain  Skin:  Negative for rash.  Neurological:  Positive for tremors. Negative for dizziness, weakness,  numbness and headaches.  Psychiatric/Behavioral:  Negative for agitation and behavioral problems.        Objective:    Physical Exam Vitals reviewed.  Constitutional:      General: He is not in acute distress.    Appearance: He is not diaphoretic.  HENT:     Head: Normocephalic and atraumatic.     Nose: Nose normal.     Mouth/Throat:     Mouth: Mucous membranes are moist.  Eyes:     General: No scleral icterus.    Extraocular Movements: Extraocular movements intact.  Cardiovascular:     Rate and Rhythm: Normal rate and regular rhythm.     Heart sounds: Normal heart sounds. No murmur heard. Pulmonary:     Breath sounds: Normal breath sounds. No wheezing or rales.  Abdominal:     Palpations: Abdomen is soft.     Tenderness: There is no abdominal tenderness.     Hernia: No hernia is present.  Musculoskeletal:     Cervical back: Neck supple. No tenderness.     Left hip: No tenderness. Normal range of motion.     Right lower leg: No edema.     Left lower leg: No edema.  Skin:    General: Skin is warm.     Findings: No rash.  Neurological:     General: No focal deficit present.     Mental Status: He is alert and oriented to person, place, and time.     Comments: Fine tremors of b/l hands  Psychiatric:        Mood and Affect: Mood normal.        Behavior: Behavior normal.     BP 128/88 (BP Location: Left Arm)   Pulse 99   Ht 6\' 4"  (1.93 m)   Wt 201 lb 6.4 oz (91.4 kg)   SpO2 96%   BMI 24.52 kg/m  Wt Readings from Last 3 Encounters:  11/17/23 201 lb 6.4 oz (91.4 kg)  10/23/23 195 lb (88.5 kg)  05/12/23 200 lb 3.2 oz (90.8 kg)        Assessment & Plan:   Problem List Items Addressed This Visit       Musculoskeletal and Integument   Psoas muscle strain, left, initial encounter - Primary   His symptoms are more likely of musculoskeletal in etiology Could be psoas muscle versus adductor strain No visible hernial sac on exam today - CT abdomen pelvis also did  not show inguinal hernia Advised to perform simple hip exercises, material provided Flexeril as needed for muscle cramps He prefers to avoid oral steroids, advised to take ibuprofen as needed for pain      Relevant Medications   cyclobenzaprine (FLEXERIL) 5 MG tablet     Meds ordered this encounter  Medications   cyclobenzaprine (FLEXERIL)  5 MG tablet    Sig: Take 1 tablet (5 mg total) by mouth 2 (two) times daily as needed.    Dispense:  30 tablet    Refill:  0     Finbar Nippert Concha Se, MD

## 2023-11-17 NOTE — Assessment & Plan Note (Signed)
His symptoms are more likely of musculoskeletal in etiology Could be psoas muscle versus adductor strain No visible hernial sac on exam today - CT abdomen pelvis also did not show inguinal hernia Advised to perform simple hip exercises, material provided Flexeril as needed for muscle cramps He prefers to avoid oral steroids, advised to take ibuprofen as needed for pain

## 2023-11-17 NOTE — Patient Instructions (Signed)
Please take Flexeril as needed for muscle spasms/cramps.  Okay to take Tylenol or Ibuprofen as needed for pain.  Please perform simple hip exercises as discussed.

## 2024-01-12 ENCOUNTER — Ambulatory Visit: Payer: BC Managed Care – PPO | Admitting: Internal Medicine

## 2024-03-22 ENCOUNTER — Encounter: Payer: Self-pay | Admitting: Internal Medicine

## 2024-03-22 ENCOUNTER — Ambulatory Visit (INDEPENDENT_AMBULATORY_CARE_PROVIDER_SITE_OTHER): Payer: Self-pay | Admitting: Internal Medicine

## 2024-03-22 VITALS — BP 128/85 | HR 67 | Ht 76.0 in | Wt 194.2 lb

## 2024-03-22 DIAGNOSIS — R739 Hyperglycemia, unspecified: Secondary | ICD-10-CM

## 2024-03-22 DIAGNOSIS — E782 Mixed hyperlipidemia: Secondary | ICD-10-CM | POA: Diagnosis not present

## 2024-03-22 DIAGNOSIS — Z0001 Encounter for general adult medical examination with abnormal findings: Secondary | ICD-10-CM | POA: Diagnosis not present

## 2024-03-22 DIAGNOSIS — E559 Vitamin D deficiency, unspecified: Secondary | ICD-10-CM

## 2024-03-22 DIAGNOSIS — Z1211 Encounter for screening for malignant neoplasm of colon: Secondary | ICD-10-CM

## 2024-03-22 DIAGNOSIS — R030 Elevated blood-pressure reading, without diagnosis of hypertension: Secondary | ICD-10-CM | POA: Diagnosis not present

## 2024-03-22 DIAGNOSIS — G250 Essential tremor: Secondary | ICD-10-CM

## 2024-03-22 NOTE — Progress Notes (Signed)
 Established Patient Office Visit  Subjective:  Patient ID: Kenneth Owens, male    DOB: 1981/10/06  Age: 43 y.o. MRN: [id number]  CC:  Chief Complaint  Patient presents with   Annual Exam    HPI [name] is a 43 y.o. male with past medical history of HLD and tremors who presents for f/u of his chronic medical conditions.  HLD: He had started taking Crestor , but had severe leg cramps, fatigue and brain fog with it. He does not have h/o type 2 DM, CVA or CAD. BP is wnl.  He did report chronic fine tremors of the bilateral hands.  He states that he would have tremors when he plays golf, especially while holding binacular and sometimes while doing fine work.  He currently works as a Hydrologist, but denies any functional disturbance due to tremors with his day-to-day activity.  Denies any numbness, tingling or weakness of the UE.  He has history of chronic neck pain and has been told of DDD of cervical spine, for which he has seen chiropractor.   Past Medical History:  Diagnosis Date   Allergy    [facility] 08/01/2020    Past Surgical History:  Procedure Laterality Date   EYE SURGERY N/A    [facility] 08/01/2020    History reviewed. No pertinent family history.  Social History   Socioeconomic History   Marital status: Married    Spouse name: Not on file   Number of children: Not on file   Years of education: Not on file   Highest education level: Master's degree (e.g., MA, MS, MEng, MEd, MSW, MBA)  Occupational History    Comment: [employer]  Tobacco Use   Smoking status: Never   Smokeless tobacco: Never  Substance and Sexual Activity   Alcohol use: Yes    Comment: at least twice in a week   Drug use: Never   Sexual activity: Never  Other Topics Concern   Not on file  Social History Narrative   Not on file   Social Drivers of Health   Financial Resource Strain: Low Risk  (10/23/2023)   Overall Financial Resource Strain (CARDIA)    Difficulty  of Paying Living Expenses: Not hard at all  Food Insecurity: No Food Insecurity (10/23/2023)   Hunger Vital Sign    Worried About Running Out of Food in the Last Year: Never true    Ran Out of Food in the Last Year: Never true  Transportation Needs: No Transportation Needs (10/23/2023)   PRAPARE - Administrator, Civil Service (Medical): No    Lack of Transportation (Non-Medical): No  Physical Activity: Insufficiently Active (10/23/2023)   Exercise Vital Sign    Days of Exercise per Week: 4 days    Minutes of Exercise per Session: 30 min  Stress: No Stress Concern Present (10/23/2023)   [employer] of Occupational Health - Occupational Stress Questionnaire    Feeling of Stress : Only a little  Social Connections: Socially Integrated (10/23/2023)   Social Connection and Isolation Panel [NHANES]    Frequency of Communication with Friends and Family: Three times a week    Frequency of Social Gatherings with Friends and Family: Twice a week    Attends Religious Services: More than 4 times per year    Active Member of [employer] or Organizations: Yes    Attends Engineer, structural: More than 4 times per year    Marital Status: Married  Catering manager Violence: Not  on file    Outpatient Medications Prior to Visit  Medication Sig Dispense Refill   cyclobenzaprine  (FLEXERIL ) 5 MG tablet Take 1 tablet (5 mg total) by mouth 2 (two) times daily as needed. 30 tablet 0   Multiple Vitamin (MULTIVITAMIN) tablet Take 1 tablet by mouth daily.     No facility-administered medications prior to visit.    Allergies  Allergen Reactions   Shellfish Allergy     ROS Review of Systems  Constitutional:  Negative for chills and fever.  HENT:  Negative for congestion, sinus pressure, sinus pain, sneezing and sore throat.   Eyes:  Negative for pain and discharge.  Respiratory:  Negative for cough and shortness of breath.   Cardiovascular:  Negative for chest pain and  palpitations.  Gastrointestinal:  Negative for constipation, diarrhea, nausea and vomiting.  Endocrine: Negative for polydipsia and polyuria.  Genitourinary:  Negative for dysuria and hematuria.  Musculoskeletal:  Negative for neck pain and neck stiffness.  Skin:  Negative for rash.  Neurological:  Positive for tremors. Negative for dizziness, weakness, numbness and headaches.  Psychiatric/Behavioral:  Negative for agitation and behavioral problems.       Objective:    Physical Exam Vitals reviewed.  Constitutional:      General: He is not in acute distress.    Appearance: He is not diaphoretic.  HENT:     Head: Normocephalic and atraumatic.     Nose: Nose normal.     Mouth/Throat:     Mouth: Mucous membranes are moist.  Eyes:     General: No scleral icterus.    Extraocular Movements: Extraocular movements intact.  Cardiovascular:     Rate and Rhythm: Normal rate and regular rhythm.     Heart sounds: Normal heart sounds. No murmur heard. Pulmonary:     Breath sounds: Normal breath sounds. No wheezing or rales.  Abdominal:     Palpations: Abdomen is soft.     Tenderness: There is no abdominal tenderness.  Musculoskeletal:     Cervical back: Neck supple. No tenderness.     Right lower leg: No edema.     Left lower leg: No edema.  Skin:    General: Skin is warm.     Findings: No rash.  Neurological:     General: No focal deficit present.     Mental Status: He is alert and oriented to person, place, and time.     Cranial Nerves: No cranial nerve deficit.     Sensory: No sensory deficit.     Motor: No weakness.     Comments: Fine tremors of b/l hands  Psychiatric:        Mood and Affect: Mood normal.        Behavior: Behavior normal.     BP 128/85   Pulse 67   Ht 6\' 4"  (1.93 m)   Wt 194 lb 3.2 oz (88.1 kg)   SpO2 98%   BMI 23.64 kg/m  Wt Readings from Last 3 Encounters:  03/22/24 194 lb 3.2 oz (88.1 kg)  11/17/23 201 lb 6.4 oz (91.4 kg)  10/23/23 195 lb  (88.5 kg)    Lab Results  Component Value Date   TSH 1.330 12/23/2022   Lab Results  Component Value Date   WBC 5.0 12/23/2022   HGB 15.0 12/23/2022   HCT 46.1 12/23/2022   MCV 92 12/23/2022   PLT 261 12/23/2022   Lab Results  Component Value Date   NA 140 05/12/2023   K  4.8 05/12/2023   CO2 23 05/12/2023   GLUCOSE 84 05/12/2023   BUN 18 05/12/2023   CREATININE 1.02 05/12/2023   BILITOT 0.6 05/12/2023   ALKPHOS 91 05/12/2023   AST 37 05/12/2023   ALT 52 (H) 05/12/2023   PROT 7.2 05/12/2023   ALBUMIN 4.7 05/12/2023   CALCIUM  9.7 05/12/2023   EGFR 93 05/12/2023   Lab Results  Component Value Date   CHOL 223 (H) 05/12/2023   Lab Results  Component Value Date   HDL 60 05/12/2023   Lab Results  Component Value Date   LDLCALC 145 (H) 05/12/2023   Lab Results  Component Value Date   TRIG 103 05/12/2023   Lab Results  Component Value Date   CHOLHDL 3.7 05/12/2023   Lab Results  Component Value Date   HGBA1C 5.5 12/23/2022      Assessment & Plan:   Problem List Items Addressed This Visit       Nervous and Auditory   Essential tremor - Primary   His fine tremors likely benign essential tremors Advised to maintain adequate hydration and eat at regular intervals Avoid caffeinated products Does not affect functional status currently If worsens, will start propranolol and/or get neurology evaluation      Relevant Orders   CMP14+EGFR   CBC with Differential/Platelet     Other   Prehypertension   BP Readings from Last 1 Encounters:  03/22/24 128/85   Well-controlled with low salt diet Advised DASH diet and moderate exercise/walking, at least 150 mins/week      Relevant Orders   TSH   Mixed hyperlipidemia   Has severe leg cramps and fatigue with crestor , prefers to avoid statin Had persistently elevated LDL despite low cholesterol diet Check lipid profile Due to his low risk profile and healthy lifestyle, discontinued statin after discussion  in 2024      Relevant Orders   Lipid panel   Encounter for general adult medical examination with abnormal findings   Physical exam as documented. Fasting blood test today. Denies Tdap vaccine today.      Screening for colon cancer   Referred to GI Has h/o diverticulosis      Relevant Orders   Ambulatory referral to Gastroenterology   Other Visit Diagnoses       Hyperglycemia       Relevant Orders   Hemoglobin A1c     Vitamin D  deficiency       Relevant Orders   VITAMIN D  25 Hydroxy (Vit-D Deficiency, Fractures)       No orders of the defined types were placed in this encounter.   Follow-up: Return in about 1 year (around 03/22/2025) for Annual physical.    Meldon Sport, MD

## 2024-03-22 NOTE — Assessment & Plan Note (Signed)
His fine tremors likely benign essential tremors Advised to maintain adequate hydration and eat at regular intervals Avoid caffeinated products Does not affect functional status currently If worsens, will start propranolol and/or get neurology evaluation

## 2024-03-22 NOTE — Assessment & Plan Note (Signed)
 Referred to GI Has h/o diverticulosis

## 2024-03-22 NOTE — Assessment & Plan Note (Signed)
 Has severe leg cramps and fatigue with crestor , prefers to avoid statin Had persistently elevated LDL despite low cholesterol diet Check lipid profile Due to his low risk profile and healthy lifestyle, discontinued statin after discussion in 2024

## 2024-03-22 NOTE — Patient Instructions (Signed)
 Please continue to take medications as prescribed.  Please continue to follow low cholesterol diet and perform moderate exercise/walking at least 150 mins/week.

## 2024-03-22 NOTE — Assessment & Plan Note (Signed)
Physical exam as documented. Fasting blood test today. Denies Tdap vaccine today.

## 2024-03-22 NOTE — Assessment & Plan Note (Signed)
 BP Readings from Last 1 Encounters:  03/22/24 128/85   Well-controlled with low salt diet Advised DASH diet and moderate exercise/walking, at least 150 mins/week

## 2024-03-23 LAB — LIPID PANEL
Chol/HDL Ratio: 4.6 ratio (ref 0.0–5.0)
Cholesterol, Total: 283 mg/dL — ABNORMAL HIGH (ref 100–199)
HDL: 62 mg/dL (ref >39)
LDL Chol Calc (NIH): 203 mg/dL — ABNORMAL HIGH (ref 0–99)
Triglycerides: 102 mg/dL (ref 0–149)
VLDL Cholesterol Cal: 18 mg/dL (ref 5–40)

## 2024-03-23 LAB — CMP14+EGFR
ALT: 27 IU/L (ref 0–44)
AST: 28 IU/L (ref 0–40)
Albumin: 4.8 g/dL (ref 4.1–5.1)
Alkaline Phosphatase: 98 IU/L (ref 44–121)
BUN/Creatinine Ratio: 15 (ref 9–20)
BUN: 18 mg/dL (ref 6–24)
Bilirubin Total: 0.6 mg/dL (ref 0.0–1.2)
CO2: 21 mmol/L (ref 20–29)
Calcium: 9.7 mg/dL (ref 8.7–10.2)
Chloride: 100 mmol/L (ref 96–106)
Creatinine, Ser: 1.17 mg/dL (ref 0.76–1.27)
Globulin, Total: 2.3 g/dL (ref 1.5–4.5)
Glucose: 86 mg/dL (ref 70–99)
Potassium: 5.2 mmol/L (ref 3.5–5.2)
Sodium: 137 mmol/L (ref 134–144)
Total Protein: 7.1 g/dL (ref 6.0–8.5)
eGFR: 79 mL/min/{1.73_m2} (ref >59)

## 2024-03-23 LAB — CBC WITH DIFFERENTIAL/PLATELET
Basophils Absolute: 0.1 10*3/uL (ref 0.0–0.2)
Basos: 1 %
EOS (ABSOLUTE): 0.1 10*3/uL (ref 0.0–0.4)
Eos: 2 %
Hematocrit: 45.8 % (ref 37.5–51.0)
Hemoglobin: 15.3 g/dL (ref 13.0–17.7)
Immature Grans (Abs): 0 10*3/uL (ref 0.0–0.1)
Immature Granulocytes: 0 %
Lymphocytes Absolute: 1.5 10*3/uL (ref 0.7–3.1)
Lymphs: 30 %
MCH: 31 pg (ref 26.6–33.0)
MCHC: 33.4 g/dL (ref 31.5–35.7)
MCV: 93 fL (ref 79–97)
Monocytes Absolute: 0.6 10*3/uL (ref 0.1–0.9)
Monocytes: 12 %
Neutrophils Absolute: 2.8 10*3/uL (ref 1.4–7.0)
Neutrophils: 55 %
Platelets: 224 10*3/uL (ref 150–450)
RBC: 4.93 x10E6/uL (ref 4.14–5.80)
RDW: 12 % (ref 11.6–15.4)
WBC: 5.1 10*3/uL (ref 3.4–10.8)

## 2024-03-23 LAB — PSA: Prostate Specific Ag, Serum: 0.9 ng/mL (ref 0.0–4.0)

## 2024-03-23 LAB — HEMOGLOBIN A1C
Est. average glucose Bld gHb Est-mCnc: 105 mg/dL
Hgb A1c MFr Bld: 5.3 % (ref 4.8–5.6)

## 2024-03-23 LAB — TSH: TSH: 1.71 u[IU]/mL (ref 0.450–4.500)

## 2024-03-23 LAB — VITAMIN D 25 HYDROXY (VIT D DEFICIENCY, FRACTURES): Vit D, 25-Hydroxy: 46.8 ng/mL (ref 30.0–100.0)

## 2024-03-25 ENCOUNTER — Encounter (INDEPENDENT_AMBULATORY_CARE_PROVIDER_SITE_OTHER): Payer: Self-pay | Admitting: *Deleted

## 2024-03-31 ENCOUNTER — Other Ambulatory Visit: Payer: Self-pay | Admitting: Internal Medicine

## 2024-03-31 DIAGNOSIS — E7801 Familial hypercholesterolemia: Secondary | ICD-10-CM

## 2024-04-01 ENCOUNTER — Ambulatory Visit: Payer: Self-pay | Admitting: Internal Medicine

## 2024-06-14 ENCOUNTER — Encounter: Payer: Self-pay | Admitting: Radiology

## 2024-08-26 ENCOUNTER — Encounter: Payer: Self-pay | Admitting: Radiology

## 2024-09-24 ENCOUNTER — Encounter (INDEPENDENT_AMBULATORY_CARE_PROVIDER_SITE_OTHER): Payer: Self-pay | Admitting: *Deleted

## 2024-11-27 ENCOUNTER — Telehealth (INDEPENDENT_AMBULATORY_CARE_PROVIDER_SITE_OTHER): Payer: Self-pay

## 2024-11-27 NOTE — Telephone Encounter (Signed)
 Who is your primary care physician: Dr. Tobie  Reasons for the colonoscopy: screening  Have you had a colonoscopy before?  no  Do you have family history of colon cancer? no  Previous colonoscopy with polyps removed? no  Do you have a history colorectal cancer?   no  Are you diabetic? If yes, Type 1 or Type 2?    no  Do you have a prosthetic or mechanical heart valve? no  Do you have a pacemaker/defibrillator?   no  Have you had endocarditis/atrial fibrillation? no  Have you had joint replacement within the last 12 months?  no  Do you tend to be constipated or have to use laxatives? no  Do you have any history of drugs or alcohol?  no  Do you use supplemental oxygen?  no  Have you had a stroke or heart attack within the last 6 months? no  Do you take weight loss medication?  no  Do you take any blood-thinning medications such as: (aspirin, warfarin, Plavix, Aggrenox)  no  If yes we need the name, milligram, dosage and who is prescribing doctor   Current Outpatient Medications  Medication Sig Dispense Refill   Multiple Vitamin (MULTIVITAMIN) tablet Take 1 tablet by mouth daily.     No current facility-administered medications for this visit.    Allergies[1]  Pharmacy: [facility]  Primary Insurance Name: [employer]  Best number where you can be reached: [phone]     [1]  Allergies Allergen Reactions   Shellfish Allergy

## 2024-11-27 NOTE — Telephone Encounter (Signed)
Ok to schedule.  Room :Any   Thanks,  Vista Lawman, MD Gastroenterology and Hepatology [facility]

## 2024-11-28 NOTE — Telephone Encounter (Signed)
 Patient states that he needs an appointment on a Monday morning, I informed him that I do not have anything at this time but I will call his once our March schedule is out and he was fine with that.

## 2025-03-28 ENCOUNTER — Encounter: Admitting: Internal Medicine
